# Patient Record
Sex: Female | Born: 1980
Health system: Southern US, Community
[De-identification: ages and names within clinical notes are randomized; demographics above are authoritative.]

## PROBLEM LIST (undated history)

## (undated) DIAGNOSIS — L709 Acne, unspecified: Secondary | ICD-10-CM

## (undated) DIAGNOSIS — N63 Unspecified lump in unspecified breast: Secondary | ICD-10-CM

## (undated) HISTORY — DX: Unspecified lump in unspecified breast: N63.0

## (undated) HISTORY — DX: Acne, unspecified: L70.9

---

## 2012-12-30 LAB — OB RESULTS CONSOLE HEPATITIS B SURFACE ANTIGEN: Hepatitis B Surface Ag: NEGATIVE

## 2013-03-09 LAB — OB RESULTS CONSOLE RUBELLA ANTIBODY, IGM: Rubella: IMMUNE

## 2013-05-31 ENCOUNTER — Encounter: Payer: Self-pay | Admitting: Obstetrics & Gynecology

## 2013-06-04 ENCOUNTER — Ambulatory Visit (INDEPENDENT_AMBULATORY_CARE_PROVIDER_SITE_OTHER): Payer: 59 | Admitting: Family Medicine

## 2013-06-04 VITALS — BP 115/78 | HR 81 | Temp 97.9°F | Resp 16 | Ht 67.5 in | Wt 122.8 lb

## 2013-06-04 DIAGNOSIS — Z331 Pregnant state, incidental: Secondary | ICD-10-CM

## 2013-06-04 DIAGNOSIS — N979 Female infertility, unspecified: Secondary | ICD-10-CM

## 2013-06-04 DIAGNOSIS — Z3490 Encounter for supervision of normal pregnancy, unspecified, unspecified trimester: Secondary | ICD-10-CM

## 2013-06-04 DIAGNOSIS — IMO0002 Reserved for concepts with insufficient information to code with codable children: Secondary | ICD-10-CM

## 2013-06-04 NOTE — Progress Notes (Signed)
   [address]   [place], [place]  [address]   [phone]  Subjective:    Patient ID: Cheryl Bailey, female    DOB: Oct 08, 1981, 32 y.o.   MRN: [id number]  HPI This 32 y.o. female presents for evaluation for porgestterone injection.  Currently [redacted] weeks pregnant.  Has been followed by infertility clinic in [place].  S/p in vitro fertilization; +pregnant on first attempt.  Nauseated; no vomiting.  Followed by infertility weekly to follow HCG level; will get first ultrasound next week.  Just moved from [facility]; husband coming back tomorrow.   Will be followed by infertility specialist in [facility] for first 12 weeks and then establish with local OB.  Presenting with orders from infertility specialist.  Review of Systems  Constitutional: Negative for fever, chills, diaphoresis and fatigue.  Gastrointestinal: Positive for nausea. Negative for vomiting.  Genitourinary: Negative for vaginal bleeding.    No past medical history on file.  No past surgical history on file.  Prior to Admission medications   Medication Sig Start Date End Date Taking? Authorizing Provider  aspirin 81 MG tablet Take 81 mg by mouth daily.   Yes Historical Provider, MD  estradiol valerate (DELESTROGEN) 20 MG/ML injection Inject 20 mg into the muscle. .43 units given on mondays and thursdays.   Yes Historical Provider, MD  Prenatal Vit-Fe Fumarate-FA (MULTIVITAMIN-PRENATAL) 27-0.8 MG TABS Take 1 tablet by mouth daily at 12 noon.   Yes Historical Provider, MD  PRESCRIPTION MEDICATION Inject 2 mLs as directed daily. . Progesterone.   Yes Historical Provider, MD    No Known Allergies  History   Social History  . Marital Status: Unknown    Spouse Name: N/A    Number of Children: N/A  . Years of Education: N/A   Occupational History  . Not on file.   Social History Main Topics  . Smoking status: Never Smoker   . Smokeless tobacco: Never Used  . Alcohol Use: No  . Drug Use: No  . Sexually Active: Yes    Other Topics Concern  . Not on file   Social History Narrative   Marital status: married; from [place]; moved to states 2011.      Children: one      Lives: with husband.      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      Tobacco: none      Alcohol:  None      Drugs: none    No family history on file.     Objective:   Physical Exam  Nursing note and vitals reviewed. Constitutional: She is oriented to person, place, and time. She appears well-developed and well-nourished. No distress.  HENT:  Head: Normocephalic and atraumatic.  Neurological: She is alert and oriented to person, place, and time.  Skin: Skin is warm and dry. She is not diaphoretic.  Psychiatric: She has a normal mood and affect. Her behavior is normal.   PROGESTERONE INJECTION 2 ML ADMINISTERED BY ANGIE MCFARLAND.    Assessment & Plan:  Infertility  Pregnancy   1. Pregnancy at 7 weeks:  Stable; followed by Infertility Specialist in [place]; presenting with orders for daily Progesterone injection 2ml IM.  Administered in office. To present to office while husband out of town.   2. Infertility: s/p successful in vitro fertilization; currently [redacted] weeks gestation; receiving twelve weeks of daily progesterone injections and twice weekly estrogen injections; orders presented from infertility specialist.

## 2013-06-06 ENCOUNTER — Ambulatory Visit (INDEPENDENT_AMBULATORY_CARE_PROVIDER_SITE_OTHER): Payer: 59 | Admitting: *Deleted

## 2013-06-06 ENCOUNTER — Encounter: Payer: Self-pay | Admitting: *Deleted

## 2013-06-06 VITALS — BP 118/74 | HR 70 | Resp 18

## 2013-06-06 DIAGNOSIS — Z331 Pregnant state, incidental: Secondary | ICD-10-CM

## 2013-06-06 DIAGNOSIS — N979 Female infertility, unspecified: Secondary | ICD-10-CM

## 2013-06-06 MED ORDER — PROGESTERONE 50 MG/ML IM OIL: 10.0000 mg | TOPICAL_OIL | Freq: Once | INTRAMUSCULAR | Status: DC

## 2013-06-06 MED ORDER — PROGESTERONE 50 MG/ML IM OIL
10.0000 mg | TOPICAL_OIL | Freq: Once | INTRAMUSCULAR | Status: AC
  Administered 2013-06-06: 10 mg via INTRAMUSCULAR

## 2013-06-07 ENCOUNTER — Ambulatory Visit (INDEPENDENT_AMBULATORY_CARE_PROVIDER_SITE_OTHER): Payer: 59 | Admitting: *Deleted

## 2013-06-07 DIAGNOSIS — N979 Female infertility, unspecified: Secondary | ICD-10-CM

## 2013-06-07 MED ORDER — PROGESTERONE 50 MG/ML IM OIL
100.0000 mg | TOPICAL_OIL | Freq: Once | INTRAMUSCULAR | Status: AC
  Administered 2013-06-07: 100 mg via INTRAMUSCULAR

## 2013-06-07 NOTE — Progress Notes (Signed)
  Subjective:    Patient ID: Cheryl Bailey, female    DOB: 06-08-1981, 32 y.o.   MRN: [id number]  HPI  32 YO female pt here today for her daily Progesterone Injection. Patient brings with her the order from her infertility specialist. Consulted Dr. Katrinka Blazing to review prior to administration.   Administered 2ml Progesterone 50mg /mL  to the Right upper outer Quad.   Pt states she forgot the Monday injection of her Delestrogen yesterday and has forgotten it again today. She will be back on Thursday.    Review of Systems     Objective:   Physical Exam        Assessment & Plan:

## 2013-06-08 ENCOUNTER — Encounter: Payer: 59 | Admitting: Obstetrics & Gynecology

## 2013-06-08 ENCOUNTER — Ambulatory Visit (INDEPENDENT_AMBULATORY_CARE_PROVIDER_SITE_OTHER): Payer: 59 | Admitting: *Deleted

## 2013-06-08 DIAGNOSIS — N979 Female infertility, unspecified: Secondary | ICD-10-CM

## 2013-06-08 MED ORDER — PROGESTERONE 50 MG/ML IM OIL
10.0000 mg | TOPICAL_OIL | Freq: Once | INTRAMUSCULAR | Status: AC
  Administered 2013-06-08: 10 mg via INTRAMUSCULAR

## 2013-06-17 ENCOUNTER — Ambulatory Visit (INDEPENDENT_AMBULATORY_CARE_PROVIDER_SITE_OTHER): Payer: 59 | Admitting: *Deleted

## 2013-06-17 DIAGNOSIS — N979 Female infertility, unspecified: Secondary | ICD-10-CM

## 2013-06-17 MED ORDER — PROGESTERONE 50 MG/ML IM OIL
100.0000 mg | TOPICAL_OIL | Freq: Once | INTRAMUSCULAR | Status: AC
  Administered 2013-06-17: 100 mg via INTRAMUSCULAR

## 2013-06-20 ENCOUNTER — Ambulatory Visit (INDEPENDENT_AMBULATORY_CARE_PROVIDER_SITE_OTHER): Payer: 59

## 2013-06-20 DIAGNOSIS — Z331 Pregnant state, incidental: Secondary | ICD-10-CM

## 2013-06-20 DIAGNOSIS — Z3490 Encounter for supervision of normal pregnancy, unspecified, unspecified trimester: Secondary | ICD-10-CM

## 2013-06-20 MED ORDER — PROGESTERONE 50 MG/ML IM OIL
100.0000 mg | TOPICAL_OIL | Freq: Once | INTRAMUSCULAR | Status: AC
  Administered 2013-06-20: 100 mg via INTRAMUSCULAR

## 2013-06-21 ENCOUNTER — Ambulatory Visit (INDEPENDENT_AMBULATORY_CARE_PROVIDER_SITE_OTHER): Payer: 59 | Admitting: Radiology

## 2013-06-21 DIAGNOSIS — Z3490 Encounter for supervision of normal pregnancy, unspecified, unspecified trimester: Secondary | ICD-10-CM

## 2013-06-21 DIAGNOSIS — Z331 Pregnant state, incidental: Secondary | ICD-10-CM

## 2013-06-21 MED ORDER — PROGESTERONE 50 MG/ML IM OIL
100.0000 mg | TOPICAL_OIL | Freq: Once | INTRAMUSCULAR | Status: AC
  Administered 2013-06-21: 100 mg via INTRAMUSCULAR

## 2013-06-22 ENCOUNTER — Ambulatory Visit (INDEPENDENT_AMBULATORY_CARE_PROVIDER_SITE_OTHER): Payer: 59 | Admitting: *Deleted

## 2013-06-22 DIAGNOSIS — N979 Female infertility, unspecified: Secondary | ICD-10-CM

## 2013-06-22 DIAGNOSIS — Z3490 Encounter for supervision of normal pregnancy, unspecified, unspecified trimester: Secondary | ICD-10-CM

## 2013-06-22 DIAGNOSIS — Z331 Pregnant state, incidental: Secondary | ICD-10-CM

## 2013-06-22 MED ORDER — PROGESTERONE 50 MG/ML IM OIL
100.0000 mg | TOPICAL_OIL | Freq: Once | INTRAMUSCULAR | Status: AC
  Administered 2013-06-22: 100 mg via INTRAMUSCULAR

## 2013-06-23 ENCOUNTER — Ambulatory Visit (INDEPENDENT_AMBULATORY_CARE_PROVIDER_SITE_OTHER): Payer: 59 | Admitting: *Deleted

## 2013-06-23 DIAGNOSIS — N979 Female infertility, unspecified: Secondary | ICD-10-CM

## 2013-06-23 MED ORDER — PROGESTERONE 50 MG/ML IM OIL
10.0000 mg | TOPICAL_OIL | Freq: Once | INTRAMUSCULAR | Status: AC
  Administered 2013-06-23: 10 mg via INTRAMUSCULAR

## 2013-06-28 ENCOUNTER — Ambulatory Visit (INDEPENDENT_AMBULATORY_CARE_PROVIDER_SITE_OTHER): Payer: 59 | Admitting: *Deleted

## 2013-06-28 DIAGNOSIS — Z3490 Encounter for supervision of normal pregnancy, unspecified, unspecified trimester: Secondary | ICD-10-CM

## 2013-06-28 DIAGNOSIS — N979 Female infertility, unspecified: Secondary | ICD-10-CM

## 2013-06-28 DIAGNOSIS — Z331 Pregnant state, incidental: Secondary | ICD-10-CM

## 2013-06-28 MED ORDER — PROGESTERONE 50 MG/ML IM OIL
100.0000 mg | TOPICAL_OIL | Freq: Once | INTRAMUSCULAR | Status: AC
  Administered 2013-06-28: 100 mg via INTRAMUSCULAR

## 2013-07-02 ENCOUNTER — Ambulatory Visit (INDEPENDENT_AMBULATORY_CARE_PROVIDER_SITE_OTHER): Payer: 59 | Admitting: Physician Assistant

## 2013-07-02 VITALS — BP 112/58 | HR 81 | Temp 98.7°F | Resp 16 | Ht 68.5 in | Wt 124.8 lb

## 2013-07-02 DIAGNOSIS — Z3490 Encounter for supervision of normal pregnancy, unspecified, unspecified trimester: Secondary | ICD-10-CM

## 2013-07-02 DIAGNOSIS — N979 Female infertility, unspecified: Secondary | ICD-10-CM

## 2013-07-02 DIAGNOSIS — IMO0002 Reserved for concepts with insufficient information to code with codable children: Secondary | ICD-10-CM

## 2013-07-02 DIAGNOSIS — Z331 Pregnant state, incidental: Secondary | ICD-10-CM

## 2013-07-02 MED ORDER — PROGESTERONE 50 MG/ML IM OIL
100.0000 mg | TOPICAL_OIL | Freq: Every day | INTRAMUSCULAR | Status: AC
  Administered 2013-07-02 – 2013-07-07 (×4): 100 mg via INTRAMUSCULAR

## 2013-07-02 NOTE — Progress Notes (Signed)
Pt presents for her injection.  She will also need one for tomorrow.

## 2013-07-03 ENCOUNTER — Ambulatory Visit (INDEPENDENT_AMBULATORY_CARE_PROVIDER_SITE_OTHER): Payer: 59 | Admitting: *Deleted

## 2013-07-03 DIAGNOSIS — IMO0002 Reserved for concepts with insufficient information to code with codable children: Secondary | ICD-10-CM

## 2013-07-03 DIAGNOSIS — N979 Female infertility, unspecified: Secondary | ICD-10-CM

## 2013-07-06 ENCOUNTER — Ambulatory Visit (INDEPENDENT_AMBULATORY_CARE_PROVIDER_SITE_OTHER): Payer: 59 | Admitting: Physician Assistant

## 2013-07-06 DIAGNOSIS — IMO0002 Reserved for concepts with insufficient information to code with codable children: Secondary | ICD-10-CM

## 2013-07-06 DIAGNOSIS — N979 Female infertility, unspecified: Secondary | ICD-10-CM

## 2013-07-06 NOTE — Progress Notes (Signed)
Patient is here for her injection. I was not part of this encounter. No patient-provider relationship occurred today. I was not made aware of an injection having occurred until after it taking place and the patient leaving the building. I was asked to close the encounter.   Eula Listen, PA-C 07/06/2013 7:03 PM

## 2013-07-07 ENCOUNTER — Ambulatory Visit (INDEPENDENT_AMBULATORY_CARE_PROVIDER_SITE_OTHER): Payer: 59 | Admitting: *Deleted

## 2013-07-07 DIAGNOSIS — IMO0002 Reserved for concepts with insufficient information to code with codable children: Secondary | ICD-10-CM

## 2013-07-07 DIAGNOSIS — N979 Female infertility, unspecified: Secondary | ICD-10-CM

## 2013-08-01 ENCOUNTER — Other Ambulatory Visit: Payer: Self-pay | Admitting: Obstetrics and Gynecology

## 2013-08-01 ENCOUNTER — Other Ambulatory Visit (HOSPITAL_COMMUNITY)
Admission: RE | Admit: 2013-08-01 | Discharge: 2013-08-01 | Disposition: A | Discharge status: Home / Self Care | Payer: Commercial Managed Care - PPO | Source: Ambulatory Visit | Attending: Obstetrics and Gynecology | Admitting: Obstetrics and Gynecology

## 2013-08-01 ENCOUNTER — Other Ambulatory Visit: Payer: Self-pay

## 2013-08-01 DIAGNOSIS — Z1151 Encounter for screening for human papillomavirus (HPV): Secondary | ICD-10-CM | POA: Insufficient documentation

## 2013-08-01 DIAGNOSIS — Z01419 Encounter for gynecological examination (general) (routine) without abnormal findings: Secondary | ICD-10-CM | POA: Insufficient documentation

## 2013-08-01 LAB — OB RESULTS CONSOLE GC/CHLAMYDIA
Chlamydia: NEGATIVE
GC PROBE AMP, GENITAL: NEGATIVE

## 2013-09-29 LAB — OB RESULTS CONSOLE RPR: RPR: NONREACTIVE

## 2013-09-29 LAB — OB RESULTS CONSOLE HIV ANTIBODY (ROUTINE TESTING): HIV: NONREACTIVE

## 2013-11-10 NOTE — L&D Delivery Note (Addendum)
Delivery Note    32yo G1P0 @ 2759w6d who presented for IOL due to IVF di/di twins (as dated by LMP c/w 9wk week ultrasound) presents for Induction of labor. The patient has been followed by antepartum surveillance including serial growth and modified BPP. A growth US performed @ 8368w1d showed Twin A with EFW of 21% with lagging AC of <3%. Per MFM recommendations- plan was made to continue with modified BPP and dopplers weekly with plan for delivery at 37-38 weeks.   IOL was started with cytotec.  She spontaneously ruptured- clear fluid and was started on PCN for GBS prophylaxis.  Induction was continued with Pitocin and she received an epidural for pain.  The patient progressed to complete dilation, an FSE was placed as the tracing was intermittent.  The patient was brought to the OR for delivery.    Verbal consent: obtained from patient for vacuum delivery.  Risks and benefits discussed in detail.  Risks include, but are not limited to the risks of anesthesia, bleeding, infection, damage to maternal tissues, fetal cephalhematoma.  Due to non-reassuring FHT at +3 station, Kiwi vacuum was applied- one pull was used, VAVD without complications of Twin A. Attention was turned to Twin B-membranes intact, with compound presentation: oblique fetal head with hand palpated. With internal manipulation, Twin B was brought vertex, AROM performed- clear fluid. Due to difficulty tracking FHT- FSE was placed on Twin B. Heart tones were noted to be in the 50s. Kiwi vacuum was applied to Twin B. Two pulls with one pop off, VAVD without complications. A second degree laceration was repaired in the usual fashion. Cervix was examined- no lacerations noted.  For further information, please see below.     Hall Busingmokpae, GIRLA Mylin [[id number]]  At 11:03 AM a viable female was delivered via Vaginal, Vacuum (Extractor) (Presentation: Left Occiput Anterior).  APGAR: 7, 8; weight 5 lb 6.2 oz (2445 g).   Placenta status: Intact,  Spontaneous Pathology.  Cord: 3 vessels with the following complications: None.  Anesthesia: Epidural  Episiotomy:  Lacerations: 2nd degree;Perineal Suture Repair: 3.0 vicryl Est. Blood Loss (mL):     Devoria Glassingmokpae, GirlB Jaylin [[id number]][[id number]]  At 11:27 AM a viable female was delivered via Vaginal, Vacuum (Extractor) (Presentation: Left Occiput Anterior).  APGAR: 7, 8; weight .   Placenta status: Intact, Spontaneous.  Cord: 3 vessels with the following complications: None.  Anesthesia: Epidural  Episiotomy: None Lacerations: 2nd degree;Perineal Suture Repair: 3.0 vicryl Est. Blood Loss (mL): 500cc   Mom to postpartum.   Baby A to Couplet care / Skin to Skin.   Baby B to Couplet care / Skin to Skin.  Myna HidalgoZAN, Sharolyn Weber, M 01/04/2014, 12:05 PM

## 2013-12-01 ENCOUNTER — Other Ambulatory Visit (HOSPITAL_COMMUNITY): Payer: Self-pay | Admitting: Obstetrics & Gynecology

## 2013-12-01 ENCOUNTER — Ambulatory Visit (HOSPITAL_COMMUNITY)
Admission: RE | Admit: 2013-12-01 | Discharge: 2013-12-01 | Disposition: A | Discharge status: Home / Self Care | Payer: 59 | Source: Ambulatory Visit | Attending: Obstetrics & Gynecology | Admitting: Obstetrics & Gynecology

## 2013-12-01 ENCOUNTER — Ambulatory Visit (HOSPITAL_COMMUNITY): Payer: Commercial Managed Care - PPO

## 2013-12-01 ENCOUNTER — Encounter (HOSPITAL_COMMUNITY): Payer: Self-pay

## 2013-12-01 DIAGNOSIS — O288 Other abnormal findings on antenatal screening of mother: Secondary | ICD-10-CM

## 2013-12-01 DIAGNOSIS — O30009 Twin pregnancy, unspecified number of placenta and unspecified number of amniotic sacs, unspecified trimester: Secondary | ICD-10-CM | POA: Insufficient documentation

## 2013-12-01 DIAGNOSIS — O289 Unspecified abnormal findings on antenatal screening of mother: Secondary | ICD-10-CM | POA: Insufficient documentation

## 2013-12-01 DIAGNOSIS — O30049 Twin pregnancy, dichorionic/diamniotic, unspecified trimester: Secondary | ICD-10-CM | POA: Insufficient documentation

## 2013-12-01 DIAGNOSIS — O09819 Supervision of pregnancy resulting from assisted reproductive technology, unspecified trimester: Secondary | ICD-10-CM | POA: Insufficient documentation

## 2013-12-01 NOTE — Progress Notes (Signed)
Baby A had decel down to 70's  At approx 1618.   Pt repositioned to right tilt with return to baseline.  MD aware.

## 2013-12-08 ENCOUNTER — Other Ambulatory Visit (HOSPITAL_COMMUNITY): Payer: Self-pay | Admitting: Obstetrics and Gynecology

## 2013-12-08 DIAGNOSIS — O30009 Twin pregnancy, unspecified number of placenta and unspecified number of amniotic sacs, unspecified trimester: Secondary | ICD-10-CM

## 2013-12-09 ENCOUNTER — Ambulatory Visit (HOSPITAL_COMMUNITY)
Admission: RE | Admit: 2013-12-09 | Discharge: 2013-12-09 | Disposition: A | Discharge status: Home / Self Care | Payer: 59 | Source: Ambulatory Visit | Attending: Obstetrics & Gynecology | Admitting: Obstetrics & Gynecology

## 2013-12-09 ENCOUNTER — Other Ambulatory Visit (HOSPITAL_COMMUNITY): Payer: Self-pay | Admitting: Obstetrics and Gynecology

## 2013-12-09 DIAGNOSIS — O09819 Supervision of pregnancy resulting from assisted reproductive technology, unspecified trimester: Secondary | ICD-10-CM | POA: Insufficient documentation

## 2013-12-09 DIAGNOSIS — O30009 Twin pregnancy, unspecified number of placenta and unspecified number of amniotic sacs, unspecified trimester: Secondary | ICD-10-CM | POA: Insufficient documentation

## 2013-12-09 DIAGNOSIS — O288 Other abnormal findings on antenatal screening of mother: Secondary | ICD-10-CM

## 2013-12-09 DIAGNOSIS — O289 Unspecified abnormal findings on antenatal screening of mother: Secondary | ICD-10-CM | POA: Diagnosis not present

## 2013-12-16 ENCOUNTER — Ambulatory Visit (HOSPITAL_COMMUNITY)
Admission: RE | Admit: 2013-12-16 | Discharge: 2013-12-16 | Disposition: A | Discharge status: Home / Self Care | Payer: 59 | Source: Ambulatory Visit | Attending: Obstetrics and Gynecology | Admitting: Obstetrics and Gynecology

## 2013-12-16 ENCOUNTER — Other Ambulatory Visit (HOSPITAL_COMMUNITY): Payer: Self-pay | Admitting: Obstetrics and Gynecology

## 2013-12-16 ENCOUNTER — Ambulatory Visit (HOSPITAL_COMMUNITY): Payer: 59

## 2013-12-16 ENCOUNTER — Encounter (HOSPITAL_COMMUNITY): Payer: Self-pay

## 2013-12-16 DIAGNOSIS — O30009 Twin pregnancy, unspecified number of placenta and unspecified number of amniotic sacs, unspecified trimester: Secondary | ICD-10-CM

## 2013-12-16 DIAGNOSIS — O09819 Supervision of pregnancy resulting from assisted reproductive technology, unspecified trimester: Secondary | ICD-10-CM | POA: Diagnosis not present

## 2013-12-20 ENCOUNTER — Other Ambulatory Visit (HOSPITAL_COMMUNITY): Payer: Self-pay | Admitting: Obstetrics and Gynecology

## 2013-12-20 DIAGNOSIS — O30009 Twin pregnancy, unspecified number of placenta and unspecified number of amniotic sacs, unspecified trimester: Secondary | ICD-10-CM

## 2013-12-22 ENCOUNTER — Ambulatory Visit (HOSPITAL_COMMUNITY)
Admission: RE | Admit: 2013-12-22 | Discharge: 2013-12-22 | Disposition: A | Discharge status: Home / Self Care | Payer: 59 | Source: Ambulatory Visit | Attending: Obstetrics and Gynecology | Admitting: Obstetrics and Gynecology

## 2013-12-22 VITALS — BP 122/77 | HR 86 | Wt 159.5 lb

## 2013-12-22 DIAGNOSIS — O30009 Twin pregnancy, unspecified number of placenta and unspecified number of amniotic sacs, unspecified trimester: Secondary | ICD-10-CM | POA: Diagnosis present

## 2013-12-22 DIAGNOSIS — O09819 Supervision of pregnancy resulting from assisted reproductive technology, unspecified trimester: Secondary | ICD-10-CM | POA: Insufficient documentation

## 2013-12-22 DIAGNOSIS — O358XX0 Maternal care for other (suspected) fetal abnormality and damage, not applicable or unspecified: Secondary | ICD-10-CM | POA: Insufficient documentation

## 2013-12-22 DIAGNOSIS — O365990 Maternal care for other known or suspected poor fetal growth, unspecified trimester, not applicable or unspecified: Secondary | ICD-10-CM | POA: Insufficient documentation

## 2013-12-23 ENCOUNTER — Ambulatory Visit (HOSPITAL_COMMUNITY): Payer: 59

## 2013-12-23 ENCOUNTER — Other Ambulatory Visit (HOSPITAL_COMMUNITY): Payer: Commercial Managed Care - PPO

## 2013-12-29 ENCOUNTER — Ambulatory Visit (HOSPITAL_COMMUNITY)
Admission: RE | Admit: 2013-12-29 | Discharge: 2013-12-29 | Disposition: A | Discharge status: Home / Self Care | Payer: 59 | Source: Ambulatory Visit | Attending: Obstetrics and Gynecology | Admitting: Obstetrics and Gynecology

## 2013-12-29 ENCOUNTER — Other Ambulatory Visit (HOSPITAL_COMMUNITY): Payer: Self-pay | Admitting: Maternal and Fetal Medicine

## 2013-12-29 DIAGNOSIS — O09819 Supervision of pregnancy resulting from assisted reproductive technology, unspecified trimester: Secondary | ICD-10-CM | POA: Insufficient documentation

## 2013-12-29 DIAGNOSIS — O30009 Twin pregnancy, unspecified number of placenta and unspecified number of amniotic sacs, unspecified trimester: Secondary | ICD-10-CM | POA: Insufficient documentation

## 2013-12-29 DIAGNOSIS — O358XX0 Maternal care for other (suspected) fetal abnormality and damage, not applicable or unspecified: Secondary | ICD-10-CM | POA: Insufficient documentation

## 2013-12-29 DIAGNOSIS — O365990 Maternal care for other known or suspected poor fetal growth, unspecified trimester, not applicable or unspecified: Secondary | ICD-10-CM | POA: Insufficient documentation

## 2013-12-30 ENCOUNTER — Ambulatory Visit (HOSPITAL_COMMUNITY): Payer: 59

## 2013-12-30 ENCOUNTER — Telehealth (HOSPITAL_COMMUNITY): Payer: Self-pay | Admitting: *Deleted

## 2013-12-30 ENCOUNTER — Encounter (HOSPITAL_COMMUNITY): Payer: Self-pay | Admitting: *Deleted

## 2013-12-30 NOTE — Telephone Encounter (Signed)
Preadmission screen  

## 2014-01-03 ENCOUNTER — Inpatient Hospital Stay (HOSPITAL_COMMUNITY)
Admission: RE | Admit: 2014-01-03 | Discharge: 2014-01-06 | DRG: 774 | Disposition: A | Discharge status: Home / Self Care | Payer: 59 | Source: Ambulatory Visit | Attending: Obstetrics & Gynecology | Admitting: Obstetrics & Gynecology

## 2014-01-03 ENCOUNTER — Encounter (HOSPITAL_COMMUNITY): Payer: Self-pay

## 2014-01-03 DIAGNOSIS — Z22330 Carrier of Group B streptococcus: Secondary | ICD-10-CM

## 2014-01-03 DIAGNOSIS — O329XX0 Maternal care for malpresentation of fetus, unspecified, not applicable or unspecified: Secondary | ICD-10-CM

## 2014-01-03 DIAGNOSIS — O99892 Other specified diseases and conditions complicating childbirth: Secondary | ICD-10-CM | POA: Diagnosis present

## 2014-01-03 DIAGNOSIS — O2650 Maternal hypotension syndrome, unspecified trimester: Secondary | ICD-10-CM | POA: Diagnosis not present

## 2014-01-03 DIAGNOSIS — D649 Anemia, unspecified: Secondary | ICD-10-CM | POA: Diagnosis present

## 2014-01-03 DIAGNOSIS — O9989 Other specified diseases and conditions complicating pregnancy, childbirth and the puerperium: Secondary | ICD-10-CM

## 2014-01-03 DIAGNOSIS — O3090 Multiple gestation, unspecified, unspecified trimester: Secondary | ICD-10-CM | POA: Diagnosis present

## 2014-01-03 DIAGNOSIS — O30049 Twin pregnancy, dichorionic/diamniotic, unspecified trimester: Secondary | ICD-10-CM

## 2014-01-03 DIAGNOSIS — O9902 Anemia complicating childbirth: Secondary | ICD-10-CM | POA: Diagnosis present

## 2014-01-03 DIAGNOSIS — O328XX0 Maternal care for other malpresentation of fetus, not applicable or unspecified: Secondary | ICD-10-CM | POA: Diagnosis present

## 2014-01-03 LAB — CBC
HCT: 34.9 % — ABNORMAL LOW (ref 36.0–46.0)
Hemoglobin: 11.9 g/dL — ABNORMAL LOW (ref 12.0–15.0)
MCH: 33.6 pg (ref 26.0–34.0)
MCHC: 34.1 g/dL (ref 30.0–36.0)
MCV: 98.6 fL (ref 78.0–100.0)
PLATELETS: 210 10*3/uL (ref 150–400)
RBC: 3.54 MIL/uL — ABNORMAL LOW (ref 3.87–5.11)
RDW: 12.9 % (ref 11.5–15.5)
WBC: 9.2 10*3/uL (ref 4.0–10.5)

## 2014-01-03 LAB — OB RESULTS CONSOLE GBS: STREP GROUP B AG: POSITIVE

## 2014-01-03 LAB — ABO/RH: ABO/RH(D): B POS

## 2014-01-03 MED ORDER — LIDOCAINE HCL (PF) 1 % IJ SOLN
30.0000 mL | INTRAMUSCULAR | Status: DC | PRN
  Filled 2014-01-03: qty 30

## 2014-01-03 MED ORDER — IBUPROFEN 600 MG PO TABS: 600.0000 mg | ORAL_TABLET | Freq: Four times a day (QID) | ORAL | Status: DC | PRN

## 2014-01-03 MED ORDER — FLEET ENEMA 7-19 GM/118ML RE ENEM: 1.0000 | ENEMA | RECTAL | Status: DC | PRN

## 2014-01-03 MED ORDER — ACETAMINOPHEN 325 MG PO TABS: 650.0000 mg | ORAL_TABLET | ORAL | Status: DC | PRN

## 2014-01-03 MED ORDER — LACTATED RINGERS IV SOLN
INTRAVENOUS | Status: DC
  Administered 2014-01-03 – 2014-01-04 (×2): via INTRAVENOUS

## 2014-01-03 MED ORDER — PENICILLIN G POTASSIUM 5000000 UNITS IJ SOLR
5.0000 10*6.[IU] | Freq: Once | INTRAMUSCULAR | Status: DC
  Filled 2014-01-03: qty 5

## 2014-01-03 MED ORDER — PENICILLIN G POTASSIUM 5000000 UNITS IJ SOLR
5.0000 10*6.[IU] | Freq: Once | INTRAVENOUS | Status: AC
  Administered 2014-01-04: 5 10*6.[IU] via INTRAVENOUS
  Filled 2014-01-03 (×2): qty 5

## 2014-01-03 MED ORDER — TERBUTALINE SULFATE 1 MG/ML IJ SOLN: 0.2500 mg | Freq: Once | INTRAMUSCULAR | Status: AC | PRN

## 2014-01-03 MED ORDER — DEXTROSE 5 % IV SOLN
2.5000 10*6.[IU] | INTRAVENOUS | Status: DC
  Administered 2014-01-04: 2.5 10*6.[IU] via INTRAVENOUS
  Filled 2014-01-03 (×2): qty 2.5

## 2014-01-03 MED ORDER — OXYTOCIN BOLUS FROM INFUSION: 500.0000 mL | INTRAVENOUS | Status: DC

## 2014-01-03 MED ORDER — OXYCODONE-ACETAMINOPHEN 5-325 MG PO TABS: 1.0000 | ORAL_TABLET | ORAL | Status: DC | PRN

## 2014-01-03 MED ORDER — CITRIC ACID-SODIUM CITRATE 334-500 MG/5ML PO SOLN: 30.0000 mL | ORAL | Status: DC | PRN

## 2014-01-03 MED ORDER — ONDANSETRON HCL 4 MG/2ML IJ SOLN: 4.0000 mg | Freq: Four times a day (QID) | INTRAMUSCULAR | Status: DC | PRN

## 2014-01-03 MED ORDER — DEXTROSE 5 % IV SOLN
2.5000 10*6.[IU] | INTRAVENOUS | Status: DC
  Filled 2014-01-03 (×2): qty 2.5

## 2014-01-03 MED ORDER — OXYTOCIN 40 UNITS IN LACTATED RINGERS INFUSION - SIMPLE MED: 62.5000 mL/h | INTRAVENOUS | Status: DC

## 2014-01-03 MED ORDER — ZOLPIDEM TARTRATE 5 MG PO TABS
5.0000 mg | ORAL_TABLET | Freq: Every evening | ORAL | Status: DC | PRN
  Administered 2014-01-03: 5 mg via ORAL
  Filled 2014-01-03 (×2): qty 1

## 2014-01-03 MED ORDER — LACTATED RINGERS IV SOLN
500.0000 mL | INTRAVENOUS | Status: DC | PRN
  Administered 2014-01-04: 1000 mL via INTRAVENOUS

## 2014-01-03 MED ORDER — MISOPROSTOL 25 MCG QUARTER TABLET
25.0000 ug | ORAL_TABLET | ORAL | Status: DC | PRN
  Administered 2014-01-03: 25 ug via VAGINAL
  Filled 2014-01-03: qty 0.25

## 2014-01-03 NOTE — H&P (Signed)
  HPI: 33 y/o G1P0 @ 7250w5d with IVF di/di twins (as dated by LMP c/w 9wk week ultrasound) presents for Induction of labor.  The patient has been followed by antepartum surveillance including serial growth and modified BPP.  A growth US performed @ 2036w1d showed Twin A with EFW of 21% with lagging AC of <3%.  Per MFM recommendations- plan was made to continue with modified BPP and dopplers weekly with plan for delivery at 37-38 weeks.    Patient reports occasional Braxton-Hicks, but no regular contractions.  no Leaking of Fluid,   no Vaginal Bleeding,   + Fetal Movement x2.  ROS: no HA, no epigastric pain, no visual changes.    Pregnancy complicated by: 1) Di/Di twins with concern for S<D in Twin A -intracardiac echogenic focus (LV) of both tiwns -antepartum surveillance as mentioned above.  Last performed on 2/19: both twins with BPP 8/8 and normal dopplers.  Last growth 2/6 @ 4236w1d: Twin A- vertex (maternal left), EFW: 2106gm, 4#10oz (21%) with AC <3%, normal doppler Twin B- variable vertex/transverse, EFW: 2271g, 5# (33%), normal doppler Discordance: 7%  2) Anemia in pregnancy- on iron daily    PNL:  GBS positive, Rub Immune, Hep B neg, RPR NR, HIV neg, GC/C neg, glucola:152, 3hr glucola- negative H/H: 10.9/32.4, plt 201 Blood type: B positive  OBHx: primip PMHx:  none Meds:  PNV, integra daily Allergy:  No Known Allergies SurgHx: none SocHx:   no Tobacco, no  EtOH, no Illicit Drugs  O: LMP 04/14/2013 Gen. AAOx3, NAD CV.  RRR  No murmur.  Resp. CTAB, no wheeze or crackles. Abd. Gravid,  no tenderness,  no rigidity,  no guarding Extr.  no edema B/L , no calf tenderness SVE: closed/long/-3, soft, posterior    Prenatal Transfer Tool  Maternal Diabetes: No Genetic Screening: Normal Maternal Ultrasounds/Referrals: Abnormal:  Findings:   Isolated EIF (echogenic intracardiac focus) Fetal Ultrasounds or other Referrals:  Referred to Materal Fetal Medicine  Maternal Substance  Abuse:  No Significant Maternal Medications:  None Significant Maternal Lab Results: Lab values include: Group B Strep positive  Labs: see orders  A/P:  33 y.o. G1P0 @ 2050w5d EGA who presents for IOL due to di/di twins with concern for lagging growth of Twin A. -FWB:  Recent BPP and doppler performed on 2/19 both twins 8/8 with normal dopplers Prior to admission delivery options were reviewed with the patient.  Based on the location of the twins- vertex/variable-vertex/transverse- all options were reviewed with patient including vaginal delivery of both twins, thereby possibly requiring maneuvers of twin B including cephalic version or breech extraction versus primary C-section delivery.  Risk, benefit and alternatives of each option were discussed with patient including but not limited to bleeding, fetal injury including fetal entrapment and need for emergent C-section.  Questions and concerns were answered and the patient wishes to proceed with vaginal delivery.  -IOL: Will start induction with cytotec per protocol.   -GBS: positive, pt to receive PCN once in active labor -Pt desires epidural for pain management   Myna HidalgoJennifer Karlene Southard, DO [phone] (pager) [phone] (office)

## 2014-01-04 ENCOUNTER — Encounter (HOSPITAL_COMMUNITY): Payer: 59 | Admitting: Anesthesiology

## 2014-01-04 ENCOUNTER — Encounter (HOSPITAL_COMMUNITY)
Admission: RE | Disposition: A | Discharge status: Home / Self Care | Payer: Self-pay | Source: Ambulatory Visit | Attending: Obstetrics & Gynecology

## 2014-01-04 ENCOUNTER — Inpatient Hospital Stay (HOSPITAL_COMMUNITY): Payer: 59 | Admitting: Anesthesiology

## 2014-01-04 ENCOUNTER — Encounter (HOSPITAL_COMMUNITY): Payer: Self-pay

## 2014-01-04 LAB — CBC
HEMATOCRIT: 28.1 % — AB (ref 36.0–46.0)
Hemoglobin: 9.5 g/dL — ABNORMAL LOW (ref 12.0–15.0)
MCH: 33.8 pg (ref 26.0–34.0)
MCHC: 33.8 g/dL (ref 30.0–36.0)
MCV: 100 fL (ref 78.0–100.0)
Platelets: 185 10*3/uL (ref 150–400)
RBC: 2.81 MIL/uL — AB (ref 3.87–5.11)
RDW: 12.9 % (ref 11.5–15.5)
WBC: 11.2 10*3/uL — AB (ref 4.0–10.5)

## 2014-01-04 LAB — POSTPARTUM HEMORRHAGE PROTOCOL (BB NOTIFICATION)

## 2014-01-04 SURGERY — Surgical Case
Anesthesia: Epidural | Site: Vagina

## 2014-01-04 MED ORDER — LACTATED RINGERS IV BOLUS (SEPSIS): 1000.0000 mL | Freq: Once | INTRAVENOUS | Status: DC

## 2014-01-04 MED ORDER — PHENYLEPHRINE 40 MCG/ML (10ML) SYRINGE FOR IV PUSH (FOR BLOOD PRESSURE SUPPORT)
80.0000 ug | PREFILLED_SYRINGE | INTRAVENOUS | Status: DC | PRN
  Filled 2014-01-04: qty 10

## 2014-01-04 MED ORDER — ONDANSETRON HCL 4 MG PO TABS: 4.0000 mg | ORAL_TABLET | ORAL | Status: DC | PRN

## 2014-01-04 MED ORDER — BISACODYL 10 MG RE SUPP
10.0000 mg | Freq: Every day | RECTAL | Status: DC | PRN
  Filled 2014-01-04: qty 1

## 2014-01-04 MED ORDER — DIBUCAINE 1 % RE OINT
1.0000 "application " | TOPICAL_OINTMENT | RECTAL | Status: DC | PRN
  Filled 2014-01-04 (×2): qty 28

## 2014-01-04 MED ORDER — BENZOCAINE-MENTHOL 20-0.5 % EX AERO
1.0000 "application " | INHALATION_SPRAY | CUTANEOUS | Status: DC | PRN
  Administered 2014-01-04: 1 via TOPICAL
  Filled 2014-01-04 (×2): qty 56

## 2014-01-04 MED ORDER — OXYTOCIN 40 UNITS IN LACTATED RINGERS INFUSION - SIMPLE MED
1.0000 m[IU]/min | INTRAVENOUS | Status: DC
Start: 2014-01-04 — End: 2014-01-04
  Administered 2014-01-04: 2 m[IU]/min via INTRAVENOUS
  Filled 2014-01-04: qty 1000

## 2014-01-04 MED ORDER — EPHEDRINE 5 MG/ML INJ: 10.0000 mg | INTRAVENOUS | Status: DC | PRN

## 2014-01-04 MED ORDER — MISOPROSTOL 200 MCG PO TABS
ORAL_TABLET | ORAL | Status: AC
  Filled 2014-01-04: qty 5

## 2014-01-04 MED ORDER — IBUPROFEN 600 MG PO TABS
600.0000 mg | ORAL_TABLET | Freq: Four times a day (QID) | ORAL | Status: DC
  Administered 2014-01-04 – 2014-01-06 (×7): 600 mg via ORAL
  Filled 2014-01-04 (×7): qty 1

## 2014-01-04 MED ORDER — LIDOCAINE HCL (PF) 1 % IJ SOLN
INTRAMUSCULAR | Status: DC | PRN
  Administered 2014-01-04 (×2): 5 mL

## 2014-01-04 MED ORDER — BUTORPHANOL TARTRATE 1 MG/ML IJ SOLN
1.0000 mg | INTRAMUSCULAR | Status: DC | PRN
  Administered 2014-01-04 (×2): 1 mg via INTRAVENOUS
  Filled 2014-01-04 (×2): qty 1

## 2014-01-04 MED ORDER — OXYCODONE-ACETAMINOPHEN 5-325 MG PO TABS: 1.0000 | ORAL_TABLET | ORAL | Status: DC | PRN

## 2014-01-04 MED ORDER — PHENYLEPHRINE 40 MCG/ML (10ML) SYRINGE FOR IV PUSH (FOR BLOOD PRESSURE SUPPORT): 80.0000 ug | PREFILLED_SYRINGE | INTRAVENOUS | Status: DC | PRN

## 2014-01-04 MED ORDER — OXYTOCIN 40 UNITS IN LACTATED RINGERS INFUSION - SIMPLE MED
62.5000 mL/h | INTRAVENOUS | Status: DC | PRN
  Administered 2014-01-04: 999 mL/h via INTRAVENOUS
  Administered 2014-01-04: 62.5 mL/h via INTRAVENOUS
  Filled 2014-01-04: qty 1000

## 2014-01-04 MED ORDER — FLEET ENEMA 7-19 GM/118ML RE ENEM: 1.0000 | ENEMA | Freq: Every day | RECTAL | Status: DC | PRN

## 2014-01-04 MED ORDER — PHENYLEPHRINE 40 MCG/ML (10ML) SYRINGE FOR IV PUSH (FOR BLOOD PRESSURE SUPPORT)
PREFILLED_SYRINGE | INTRAVENOUS | Status: AC
  Filled 2014-01-04: qty 5

## 2014-01-04 MED ORDER — EPHEDRINE 5 MG/ML INJ
10.0000 mg | INTRAVENOUS | Status: DC | PRN
  Filled 2014-01-04: qty 4

## 2014-01-04 MED ORDER — ONDANSETRON HCL 4 MG/2ML IJ SOLN: 4.0000 mg | INTRAMUSCULAR | Status: DC | PRN

## 2014-01-04 MED ORDER — SENNOSIDES-DOCUSATE SODIUM 8.6-50 MG PO TABS
2.0000 | ORAL_TABLET | ORAL | Status: DC
  Administered 2014-01-04 – 2014-01-06 (×2): 2 via ORAL
  Filled 2014-01-04 (×2): qty 2

## 2014-01-04 MED ORDER — PRENATAL MULTIVITAMIN CH
1.0000 | ORAL_TABLET | Freq: Every day | ORAL | Status: DC
  Administered 2014-01-05: 1 via ORAL
  Filled 2014-01-04: qty 1

## 2014-01-04 MED ORDER — DIPHENHYDRAMINE HCL 25 MG PO CAPS: 25.0000 mg | ORAL_CAPSULE | Freq: Four times a day (QID) | ORAL | Status: DC | PRN

## 2014-01-04 MED ORDER — FENTANYL 2.5 MCG/ML BUPIVACAINE 1/10 % EPIDURAL INFUSION (WH - ANES)
14.0000 mL/h | INTRAMUSCULAR | Status: DC | PRN
  Administered 2014-01-04: 14 mL/h via EPIDURAL
  Filled 2014-01-04: qty 125

## 2014-01-04 MED ORDER — OXYTOCIN 40 UNITS IN LACTATED RINGERS INFUSION - SIMPLE MED: 1.0000 m[IU]/min | INTRAVENOUS | Status: DC

## 2014-01-04 MED ORDER — PHENYLEPH-SHARK LIV OIL-MO-PET 0.25-3-14-71.9 % RE OINT: TOPICAL_OINTMENT | Freq: Two times a day (BID) | RECTAL | Status: DC | PRN

## 2014-01-04 MED ORDER — LANOLIN HYDROUS EX OINT: TOPICAL_OINTMENT | CUTANEOUS | Status: DC | PRN

## 2014-01-04 MED ORDER — SIMETHICONE 80 MG PO CHEW: 80.0000 mg | CHEWABLE_TABLET | ORAL | Status: DC | PRN

## 2014-01-04 MED ORDER — ZOLPIDEM TARTRATE 5 MG PO TABS: 5.0000 mg | ORAL_TABLET | Freq: Every evening | ORAL | Status: DC | PRN

## 2014-01-04 MED ORDER — LACTATED RINGERS IV SOLN
500.0000 mL | Freq: Once | INTRAVENOUS | Status: AC
  Administered 2014-01-04: 500 mL via INTRAVENOUS

## 2014-01-04 MED ORDER — PHENYLEPHRINE 40 MCG/ML (10ML) SYRINGE FOR IV PUSH (FOR BLOOD PRESSURE SUPPORT)
PREFILLED_SYRINGE | INTRAVENOUS | Status: AC
  Filled 2014-01-04: qty 10

## 2014-01-04 MED ORDER — WITCH HAZEL-GLYCERIN EX PADS
1.0000 "application " | MEDICATED_PAD | CUTANEOUS | Status: DC | PRN
  Administered 2014-01-04: 1 via TOPICAL

## 2014-01-04 MED ORDER — DIPHENHYDRAMINE HCL 50 MG/ML IJ SOLN: 12.5000 mg | INTRAMUSCULAR | Status: DC | PRN

## 2014-01-04 NOTE — Progress Notes (Signed)
OR notified of patient status

## 2014-01-04 NOTE — Progress Notes (Signed)
At bedside due to symptomatic hypotension:  Per RN: After passage of two large clots ~ 500cc.  Pt noted to be hypotensive with loss of consciousness. Anesthesia was in the room at the time- fluid bolus and ephedrine was given.  A 2nd IV was started, stat CBC was collected.  PPH drill was called.  Patient responded immediately- now alert and awake, already started to feel better.  On examination- uterus remains firm below umbilicus.  Lochia appropriate.  Suspect large clots from boggy lower uterine segment.      Hypotension due to rapid volume loss and passage of clots.  Will hold off on uterotonics as her uterus remains firm.  Continue IV fluids, strict I/Os, will consider transfusion if Hgb <7.    Myna HidalgoJennifer Tehillah Cipriani, DO [phone]) [phone] (office)

## 2014-01-04 NOTE — Anesthesia Procedure Notes (Signed)
Epidural Patient location during procedure: OB Start time: 01/04/2014 5:01 AM  Staffing Anesthesiologist: Brayton CavesJACKSON, Danaja Lasota Performed by: anesthesiologist   Preanesthetic Checklist Completed: patient identified, site marked, surgical consent, pre-op evaluation, timeout performed, IV checked, risks and benefits discussed and monitors and equipment checked  Epidural Patient position: sitting Prep: site prepped and draped and DuraPrep Patient monitoring: continuous pulse ox and blood pressure Approach: midline Injection technique: LOR air  Needle:  Needle type: Tuohy  Needle gauge: 17 G Needle length: 9 cm and 9 Needle insertion depth: 5 cm cm Catheter type: closed end flexible Catheter size: 19 Gauge Catheter at skin depth: 10 cm Test dose: negative  Assessment Events: blood not aspirated, injection not painful, no injection resistance, negative IV test and no paresthesia  Additional Notes Patient identified.  Risk benefits discussed including failed block, incomplete pain control, headache, nerve damage, paralysis, blood pressure changes, nausea, vomiting, reactions to medication both toxic or allergic, and postpartum back pain.  Patient expressed understanding and wished to proceed.  All questions were answered.  Sterile technique used throughout procedure and epidural site dressed with sterile barrier dressing. No paresthesia or other complications noted.The patient did not experience any signs of intravascular injection such as tinnitus or metallic taste in mouth nor signs of intrathecal spread such as rapid motor block. Please see nursing notes for vital signs.

## 2014-01-04 NOTE — Progress Notes (Signed)
Per Dr Charlotta Newtonzan...POC to deliver twins in OR setting.

## 2014-01-04 NOTE — Progress Notes (Signed)
Transfer from OR to 163 for continued recovery of SVD.

## 2014-01-04 NOTE — Progress Notes (Signed)
Anesthesia notified of epidural catheter remaining in place per history of PPH this morning.  Anesthesia will reevaluate this afternoon.

## 2014-01-04 NOTE — Anesthesia Preprocedure Evaluation (Signed)
Anesthesia Evaluation  Patient identified by MRN, date of birth, ID band Patient awake    Reviewed: Allergy & Precautions, H&P , Patient's Chart, lab work & pertinent test results  Airway Mallampati: II  TM Distance: >3 FB Neck ROM: full    Dental   Pulmonary  breath sounds clear to auscultation        Cardiovascular Rhythm:regular Rate:Normal     Neuro/Psych    GI/Hepatic   Endo/Other    Renal/GU      Musculoskeletal   Abdominal   Peds  Hematology   Anesthesia Other Findings twins  Reproductive/Obstetrics (+) Pregnancy                             Anesthesia Physical Anesthesia Plan  ASA: II  Anesthesia Plan: Epidural   Post-op Pain Management:    Induction:   Airway Management Planned:   Additional Equipment:   Intra-op Plan:   Post-operative Plan:   Informed Consent: I have reviewed the patients History and Physical, chart, labs and discussed the procedure including the risks, benefits and alternatives for the proposed anesthesia with the patient or authorized representative who has indicated his/her understanding and acceptance.     Plan Discussed with:   Anesthesia Plan Comments:         Anesthesia Quick Evaluation  

## 2014-01-04 NOTE — Progress Notes (Signed)
Anesthesia at bedside.  PPH initiated

## 2014-01-04 NOTE — Progress Notes (Signed)
Patient called out stating she felt a gush. Bleeding assessed, small. Pad changed and patient felt another gush with elevating hips. Small gush seen, fundus firm 2 below umbilicus but very deviated to right. Foley draining well. New pad applied, patient told to call for any more gushes. Will reassess in about an hour. Dr. Charlotta Newtonzan notified of fundus being very deviated to right and charted that way since 1530. No new orders. Patient may have foley until AM.

## 2014-01-04 NOTE — Progress Notes (Signed)
Dr Charlotta Newtonzan updated on lab results.  Continue to monitor I+O and BP's.

## 2014-01-04 NOTE — Lactation Note (Signed)
This note was copied from the chart of Cheryl Kassy Tortora. Lactation Consultation Note  Patient Name: Cheryl Bailey Today's Date: 01/04/2014 Reason for consult: Initial assessment;Multiple gestation;Late preterm infant;Infant < 6lbs.  LC arrived to observe baby "A" well-latched to mom's (L) breast with widely flanged lips and some intermittent strong sucks.  Swallows undetectable but mom says baby has already been latched for almost 30 minutes.  Mom also reports that babies received a bottle with formula when she was too ill to breastfeed them but she plans to exclusively breastfeed now and states "B" baby has also latched once.  LC discussed need for frequent STS and a minimum of q3h feedings due to babies weights and gestation.  LC discussed and encouraged STS, cue feeding, signs of proper latch and milk transfer and hand expression with use as nipple "cream" after feeding babies.  LC encouraged review of Baby and Me pp 9, 14 and 20-25 for STS and BF information. LC provided LC Resource brochure and reviewed WH services and list of community and web site resources.    Maternal Data Formula Feeding for Exclusion: No Infant to breast within first hour of birth: No Breastfeeding delayed due to:: Maternal status Has patient been taught Hand Expression?: Yes Does the patient have breastfeeding experience prior to this delivery?: No  Feeding Feeding Type: Breast Fed Length of feed: 30 min (LC observed baby well-latched at end of feeding)  LATCH Score/Interventions      Observed "A" well-latched                Lactation Tools Discussed/Used  STS, cue feedings, hand expression and nipple care q3h feedings as minimum for preterm/small babies   Consult Status Consult Status: Follow-up Date: 01/05/14 Follow-up type: In-patient    Tawana Pasch Parmly 01/04/2014, 9:39 PM    

## 2014-01-04 NOTE — Progress Notes (Signed)
OB PN  S: Patient resting comfortably with epidural.  O: BP 105/58  Pulse 67  Temp(Src) 98.1 F (36.7 C) (Oral)  Resp 16  Ht 5\' 8"  (1.727 m)  Wt 73.483 kg (162 lb)  BMI 24.64 kg/m2  SpO2 100%  LMP 04/14/2013  FHT: Twin A: 145, moderate variability, no accels, no decels Twin B: 135, moderate variability, no accels, no decels Toco: q2-614min, SVE: per RN @ 0630: 4/90/-2  CBC    Component Value Date/Time   WBC 9.2 01/03/2014 2025   RBC 3.54* 01/03/2014 2025   HGB 11.9* 01/03/2014 2025   HCT 34.9* 01/03/2014 2025   PLT 210 01/03/2014 2025   MCV 98.6 01/03/2014 2025   MCH 33.6 01/03/2014 2025   MCHC 34.1 01/03/2014 2025   RDW 12.9 01/03/2014 2025    A/P: A/P: 33 y.o. G1P0 @ 7089w6d with di/di twins- transitioning to active labor. -FWB- Cat. I reassuring x 2 -IOL: s/p cytotec x 1, SROM, continue Pit per protocol -GBS prophylaxis: continue PCN per protocol -Pain management: epidural in place  Myna HidalgoJennifer Jamisen Duerson, DO [phone]) [phone] (office)

## 2014-01-04 NOTE — Progress Notes (Signed)
Patient still has epidural catheter in. Called Dr. Brayton CavesFreeman Jackson at 2130, who said it is okay to d/c epidural as long as okay with OB MD. Paged Dr. Charlotta Newtonzan at 2138. Order received. Will continue to monitor.

## 2014-01-04 NOTE — Anesthesia Postprocedure Evaluation (Signed)
Anesthesia Post Note  Patient: Cheryl Bailey  Procedure(s) Performed: * No procedures listed *  Anesthesia type: Epidural  Patient location: Mother/Baby  Post pain: Pain level controlled  Post assessment: Post-op Vital signs reviewed  Last Vitals:  Filed Vitals:   01/04/14 1637  BP: 107/62  Pulse: 84  Temp: 36.9 C  Resp: 18    Post vital signs: Reviewed  Level of consciousness:alert  Complications: No apparent anesthesia complications

## 2014-01-04 NOTE — Progress Notes (Signed)
Pt states she is dizzy when sitting up.  VSS.  No new bleeding.

## 2014-01-04 NOTE — Progress Notes (Signed)
Pt states she is dizzy when she sits up.  VSS.  No new bleeding.  Will have pt eat regular diet before attempting to get up out of bed.

## 2014-01-05 ENCOUNTER — Encounter (HOSPITAL_COMMUNITY): Payer: Self-pay | Admitting: Obstetrics & Gynecology

## 2014-01-05 LAB — CBC
HEMATOCRIT: 20 % — AB (ref 36.0–46.0)
HEMOGLOBIN: 7 g/dL — AB (ref 12.0–15.0)
MCH: 34.3 pg — AB (ref 26.0–34.0)
MCHC: 35 g/dL (ref 30.0–36.0)
MCV: 98 fL (ref 78.0–100.0)
Platelets: 155 10*3/uL (ref 150–400)
RBC: 2.04 MIL/uL — AB (ref 3.87–5.11)
RDW: 12.8 % (ref 11.5–15.5)
WBC: 16.3 10*3/uL — ABNORMAL HIGH (ref 4.0–10.5)

## 2014-01-05 LAB — RPR: RPR Ser Ql: NONREACTIVE

## 2014-01-05 MED ORDER — FERROUS GLUCONATE 324 (38 FE) MG PO TABS
324.0000 mg | ORAL_TABLET | Freq: Three times a day (TID) | ORAL | Status: DC
  Administered 2014-01-05 – 2014-01-06 (×3): 324 mg via ORAL
  Filled 2014-01-05 (×4): qty 1

## 2014-01-05 MED ORDER — OXYCODONE-ACETAMINOPHEN 5-325 MG PO TABS: 1.0000 | ORAL_TABLET | Freq: Four times a day (QID) | ORAL | Status: DC | PRN

## 2014-01-05 MED ORDER — IBUPROFEN 600 MG PO TABS: 600.0000 mg | ORAL_TABLET | Freq: Four times a day (QID) | ORAL | Status: DC

## 2014-01-05 NOTE — Lactation Note (Signed)
This note was copied from the chart of Cheryl Davionna Eckmann. Lactation Consultation Note: Baby B wakes easily and latched well but sleepy and non nutritive at the breast after a few minutes. Reviewed waking techniques with mom- stimulation to keep baby awake and nursing. Encouraged to try to feed no more that 20- 30 minutes to conserve calories. To feed EBM and/ or formula as needed. Attempted to nurse both babies at the same time but baby A would not latch. No further questions at present. To call for assist prn.  Patient Name: Cheryl Bailey Today's Date: 01/05/2014 Reason for consult: Follow-up assessment   Maternal Data    Feeding Feeding Type: Breast Fed Length of feed: 30 min  LATCH Score/Interventions Latch: Grasps breast easily, tongue down, lips flanged, rhythmical sucking.  Audible Swallowing: None  Type of Nipple: Everted at rest and after stimulation  Comfort (Breast/Nipple): Soft / non-tender     Hold (Positioning): Assistance needed to correctly position infant at breast and maintain latch. Intervention(s): Breastfeeding basics reviewed;Support Pillows;Position options  LATCH Score: 7  Lactation Tools Discussed/Used Pump Review: Setup, frequency, and cleaning Initiated by:: DW Date initiated:: 01/05/14   Consult Status Consult Status: Follow-up Date: 01/06/14 Follow-up type: In-patient    Warner Laduca D 01/05/2014, 3:54 PM    

## 2014-01-05 NOTE — Progress Notes (Signed)
Postpartum Note Day # 1  S:  Patient resting comfortable in bed.  Pain controlled.  Tolerating general diet. + flatus, no BM.  Lochia moderate.  Ambulating without difficulty.  Foley removed this am, voiding freely.  She denies n/v/f/c, SOB, or CP.  Pt plans on breastfeeding.  O: Temp:  [98.3 F (36.8 C)-98.9 F (37.2 C)] 98.7 F (37.1 C) (02/25 2015) Pulse Rate:  [53-205] 108 (02/26 1030) Resp:  [12-24] 20 (02/26 1030) BP: (79-120)/(29-74) 108/63 mmHg (02/26 1030) SpO2:  [98 %-100 %] 98 % (02/25 2015) UOP: 1300cc/overnight 8hr  Gen: A&Ox3, NAD CV: RRR, no tachycardia appreciated  Resp: CTAB Abdomen: soft, NT, ND Uterus: firm, non-tender, below umbilicus Ext: No edema, no calf tenderness bilaterally  Labs:  CBC    Component Value Date/Time   WBC 16.3* 01/05/2014 0600   RBC 2.04* 01/05/2014 0600   HGB 7.0* 01/05/2014 0600   HCT 20.0* 01/05/2014 0600   PLT 155 01/05/2014 0600   MCV 98.0 01/05/2014 0600   MCH 34.3* 01/05/2014 0600   MCHC 35.0 01/05/2014 0600   RDW 12.8 01/05/2014 0600    A/P: Pt is a 33 y.o. G1P1002 s/p VAVD x2 of di/di twins complicated by PPH.  -PPH: Total EBL: 1000cc, Hgb drop for 9.2--> 7.  Pt is currently asymptomatic.  VSS and UOP adequate.  Continue iron 3x daily and po hydration.  Will hold off on transfusion at this time. - Pain well controlled -GU: UOP is adequate -GI: Tolerating general diet -Activity: encouraged sitting up to chair and ambulation as tolerated -Prophylaxis: early ambulation -Pt breastfeeding without difficulty -Plan for discharge tmr  Myna HidalgoJennifer Shaundra Fullam, DO [phone]) [phone] (office)

## 2014-01-05 NOTE — Lactation Note (Signed)
This note was copied from the chart of GIRLA Meaghan Roker. Lactation Consultation Note: Mom reports that baby B is nursing better. Baby A sleepy at the breast- would not latch. Mom had pumped 3 cc's of Colostrum- assisted grandmother with spoon feeding. Encouraged her to let baby take milk- not pour it in. Reviewed setup, use and cleaning of DEBP. Mom is [employer] employee and will get pump from us before DC. Mom pleased to see some Colostrum. No questions at present. To call for assist prn  Patient Name: Mliss FritzGIRLA Margarett Mccartt IONGE'XToday's Date: 01/05/2014 Reason for consult: Follow-up assessment   Maternal Data    Feeding Feeding Type: Breast Fed Length of feed: 0 min  LATCH Score/Interventions Latch: Too sleepy or reluctant, no latch achieved, no sucking elicited.  Audible Swallowing: None  Type of Nipple: Everted at rest and after stimulation  Comfort (Breast/Nipple): Soft / non-tender     Hold (Positioning): Assistance needed to correctly position infant at breast and maintain latch.  LATCH Score: 5  Lactation Tools Discussed/Used WIC Program: No Pump Review: Setup, frequency, and cleaning Initiated by:: DW Date initiated:: 01/05/14   Consult Status Consult Status: Follow-up Date: 01/06/14 Follow-up type: In-patient    Pamelia HoitWeeks, Evalyse Stroope D 01/05/2014, 3:48 PM

## 2014-01-06 ENCOUNTER — Ambulatory Visit (HOSPITAL_COMMUNITY): Payer: 59

## 2014-01-06 LAB — TYPE AND SCREEN
ABO/RH(D): B POS
Antibody Screen: NEGATIVE
UNIT DIVISION: 0
UNIT DIVISION: 0

## 2014-01-06 NOTE — Discharge Instructions (Signed)
Vaginal Delivery Care After Refer to this sheet in the next few weeks. These discharge instructions provide you with information on caring for yourself after delivery. Your caregiver may also give you specific instructions. Your treatment has been planned according to the most current medical practices available, but problems sometimes occur. Call your caregiver if you have any problems or questions after you go home. HOME CARE INSTRUCTIONS  For pain: Percocet for severe pain- otherwise over the counter motrin or tylenol as needed.  For anemia: Take integra at least twice daily.  To prevent constipation: Continue with Colace twice daily and preparation H.  Do not drink alcohol, especially if you are breastfeeding or taking medicine to relieve pain.  Do not chew or smoke tobacco.  Do not use illegal drugs.  Continue to use good perineal care. Good perineal care includes:  Wiping your perineum from front to back.  Keeping your perineum clean.  Do not use tampons or douche until your caregiver says it is okay.  Shower, wash your hair, and take tub baths as directed by your caregiver.  Wear a well-fitting bra that provides breast support.  Eat healthy foods.  Drink enough fluids to keep your urine clear or pale yellow.  Eat high-fiber foods such as whole grain cereals and breads, brown rice, beans, and fresh fruits and vegetables every day. These foods may help prevent or relieve constipation.  Follow your cargiver's recommendations regarding resumption of activities such as climbing stairs, driving, lifting, exercising, or traveling.  Talk to your caregiver about resuming sexual activities. Resumption of sexual activities is dependent upon your risk of infection, your rate of healing, and your comfort and desire to resume sexual activity.  Try to have someone help you with your household activities and your newborn for at least a few days after you leave the hospital.  Rest as  much as possible. Try to rest or take a nap when your newborn is sleeping.  Increase your activities gradually.  Keep all of your scheduled postpartum appointments. It is very important to keep your scheduled follow-up appointments. At these appointments, your caregiver will be checking to make sure that you are healing physically and emotionally. SEEK MEDICAL CARE IF:   You are passing large clots from your vagina. Save any clots to show your caregiver.  You have a foul smelling discharge from your vagina.  You have trouble urinating.  You are urinating frequently.  You have pain when you urinate.  You have a change in your bowel movements.  You have increasing redness, pain, or swelling near your vaginal incision (episiotomy) or vaginal tear.  You have pus draining from your episiotomy or vaginal tear.  Your episiotomy or vaginal tear is separating.  You have painful, hard, or reddened breasts.  You have a severe headache.  You have blurred vision or see spots.  You feel sad or depressed.  You have thoughts of hurting yourself or your newborn.  You have questions about your care, the care of your newborn, or medicines.  You are dizzy or lightheaded.  You have a rash.  You have nausea or vomiting.  You were breastfeeding and have not had a menstrual period within 12 weeks after you stopped breastfeeding.  You are not breastfeeding and have not had a menstrual period by the 12th week after delivery.  You have a fever. SEEK IMMEDIATE MEDICAL CARE IF:   You have persistent pain.  You have chest pain.  You have shortness of breath.  You faint.  You have leg pain.  You have stomach pain.  Your vaginal bleeding saturates two or more sanitary pads in 1 hour. MAKE SURE YOU:   Understand these instructions.  Will watch your condition.  Will get help right away if you are not doing well or get worse. Document Released: 10/24/2000 Document Revised:  07/21/2012 Document Reviewed: 06/23/2012 [facility] Patient Information 2014 [place].

## 2014-01-06 NOTE — Discharge Summary (Signed)
Obstetric Discharge Summary Reason for Admission: induction of labor Date of Admission: 01/03/14 Date of Discharge: 01/06/14 Prenatal Procedures: BPP and ultrasound Intrapartum Procedures: vacuumx2 Postpartum Procedures: none Complications-Operative and Postpartum: 2nd degree perineal laceration and hemorrhage Hemoglobin  Date Value Ref Range Status  01/05/2014 7.0* 12.0 - 15.0 g/dL Final     REPEATED TO VERIFY     DELTA CHECK NOTED     HCT  Date Value Ref Range Status  01/05/2014 20.0* 36.0 - 46.0 % Final    Physical Exam:  General: alert, cooperative and no distress Lochia: appropriate Uterine Fundus: firm DVT Evaluation: No evidence of DVT seen on physical exam.  Discharge Diagnoses: Term Pregnancy-delivered Discharge Information: Date: 01/06/2014  Hospital Course:   33 y/o G1P0 @ 75w5dwith IVF di/di twins (as dated by LMP c/w 9wk week ultrasound) presented for Induction of labor. The patient has been followed by antepartum surveillance including serial growth and modified BPP. A growth UKoreaperformed @ 323w1dhowed Twin A with EFW of 21% with lagging AC of <3%. Per MFM recommendations- plan was made to continue with modified BPP and dopplers weekly with plan for delivery at 37-38 weeks.  Pregnancy complicated by:  1) Di/Di twins with concern for S<D in Twin A  -intracardiac echogenic focus (LV) of both tiwns  -antepartum surveillance as mentioned above. Last performed on 2/19: both twins with BPP 8/8 and normal dopplers. Last growth 2/6 @ 3558w1dwin A- vertex (maternal left), EFW: 2106gm, 4#10oz (21%) with AC <3%, normal doppler, Twin B- variable vertex/transverse, EFW: 2271g, 5# (33%), normal doppler, Discordance: 7%  2) Anemia in pregnancy- on iron daily  IOL was started with cytotec. She spontaneously ruptured- clear fluid and was started on PCN for GBS prophylaxis. Induction was continued with Pitocin and she received an epidural for pain. The patient progressed to complete  dilation, an FSE was placed as the tracing was intermittent. The patient was brought to the OR for delivery.  Due to non-reassuring FHT at +3 station, Kiwi vacuum was applied- one pull was used, VAVD without complications of Twin A. Attention was turned to Twin B-membranes intact, with compound presentation: oblique fetal head with hand palpated. With internal manipulation, Twin B was brought vertex, AROM performed- clear fluid. Due to difficulty tracking FHT- FSE was placed on Twin B. Heart tones were noted to be in the 50s. Kiwi vacuum was applied to Twin B. Two pulls with one pop off, VAVD without complications. A second degree laceration was repaired in the usual fashion. Cervix was examined- no lacerations noted.  Following delivery during her recovery period, the patient was noted to have a PPHClaremontth passage of large clots and syncopal episode due to hypotension.  (Total EBL 1000cc)  Pt was given IV fluids, ephedrine and quickly recovered.  Uterus was firm, below the umbilicus with minimal bleeding.  Hgb noted to be 9.5 from 11.9 with stable vital signs and adequate UOP. No additional uterotonics or blood transfusion were needed.  Pt did well postpartum and met all milestones appropriately.  She was discharged home in stable condition, postpartum Day #2.      Activity: pelvic rest Diet: routine Medications: PNV, Ibuprofen, Colace, Iron and Percocet Condition: stable Instructions: refer to practice specific booklet Discharge to: home Follow-up Information   Follow up with OZAJanyth Pupa, DO In 6 weeks.   Specialty:  Obstetrics and Gynecology   Contact information:   [address] [phone]  Newborn Data:   Nailani, Full [[id number]]  Live born female  Birth Weight: 5 lb 6.2 oz (2445 g) APGAR: 7, 8   Nakiya, Rallis [[id number]]  Live born female  Birth Weight: 5 lb 9.1 oz (2525 g) APGAR: 7, 8  Home with  mother.  Janyth Pupa, M 01/06/2014, 10:08 AM

## 2014-01-06 NOTE — Progress Notes (Signed)
Postpartum Note Day # 2  S:  Patient up ambulating without difficulty this am.  Pain controlled.  Tolerating general diet. + flatus, no BM.  Lochia minimal.  Voiding freely.  She denies n/v/f/c, SOB, or CP.  Breastfeeding without difficulty.  O: Temp:  [98.4 F (36.9 C)-98.7 F (37.1 C)] 98.7 F (37.1 C) (02/27 0600) Pulse Rate:  [101-108] 101 (02/27 0600) Resp:  [18-20] 18 (02/27 0600) BP: (103-108)/(63-69) 106/67 mmHg (02/27 0600) SpO2:  [97 %] 97 % (02/27 0600)  Gen: A&Ox3, NAD CV: RRR, no tachycardia appreciated  Resp: CTAB Abdomen: soft, NT, ND Uterus: firm, non-tender, below umbilicus Ext: No edema, no calf tenderness bilaterally  Labs:  CBC    Component Value Date/Time   WBC 16.3* 01/05/2014 0600   RBC 2.04* 01/05/2014 0600   HGB 7.0* 01/05/2014 0600   HCT 20.0* 01/05/2014 0600   PLT 155 01/05/2014 0600   MCV 98.0 01/05/2014 0600   MCH 34.3* 01/05/2014 0600   MCHC 35.0 01/05/2014 0600   RDW 12.8 01/05/2014 0600    A/P: Pt is a 33 y.o. G1P1002 s/p VAVD x2 of di/di twins complicated by PPH.  PPD #2  -PPH: Total EBL: 1000cc, Hgb drop for 9.2--> 7.  Pt is currently asymptomatic and doing well.  VSS and UOP adequate.  Reports occasional tachycardia with increased activity, but overall feels fine.  Continue iron 3x daily and po hydration.  - Pain well controlled -GU: UOP is adequate -GI: Tolerating general diet -Activity: encouraged sitting up to chair and ambulation as tolerated -Prophylaxis: early ambulation Plan for discharge home today with follow up in 6 weeks.  Advised patient that once at home if she notices dizziness, headache or heart palpitation to notify the office immediately.  Myna HidalgoJennifer Fannie Alomar, DO [phone] (pager) [phone] (office)

## 2014-01-07 ENCOUNTER — Ambulatory Visit: Payer: Self-pay

## 2014-01-07 NOTE — Lactation Note (Signed)
This note was copied from the chart of GirlA Gloriajean Zartman.     Lactation Consultation Note  Patient Name: Cheryl FritzGirlA Cheryl Bailey ZOXWR'UToday's Date: 01/07/2014   Visited with Mom and FOB on day of discharge.  Babies are 5171 hrs old. To go home on phototherapy with follow up Monday with Pediatrician.  Babys both getting bottle supplement following time at the breast.  Babies to be fed at least every 3 hrs.  Encouraged this along with regular double pumping every 3 hrs for 15-20 mins.  Explained importance of pumping both breasts at the same time with regards to elevating her prolactin levels.  (Mom states she pumped for an hour last evening, one breast at a time) Mom has a Medela PIS Advanced at home. Encouraged continued supplementation until her OP lactation appointment for a Feeding Assessment on March 6th.  Volume parameters discussed using 22 cal formula until milk volume increasing.  To call our office prn  Maternal Data    Feeding    LATCH Score/Interventions                      Lactation Tools Discussed/Used     Consult Status      Cheryl Bailey, Cheryl Bailey 01/07/2014, 10:58 AM

## 2014-01-13 ENCOUNTER — Ambulatory Visit (HOSPITAL_COMMUNITY): Payer: 59

## 2014-01-23 ENCOUNTER — Ambulatory Visit (HOSPITAL_COMMUNITY): Payer: 59

## 2014-01-23 ENCOUNTER — Inpatient Hospital Stay (HOSPITAL_COMMUNITY)
Admission: RE | Admit: 2014-01-23 | Discharge: 2014-01-23 | Disposition: A | Discharge status: Home / Self Care | Payer: 59 | Source: Ambulatory Visit | Attending: Obstetrics & Gynecology | Admitting: Obstetrics & Gynecology

## 2014-01-23 NOTE — Lactation Note (Signed)
Adult Lactation Consultation Outpatient Visit Note  Patient Name: Cheryl Bailey                                    38 week Twins, 2 weeks and 64  days old Date of Birth: Apr 07, 1981                                                          "Verlon Au" Baby A :Weight today 6-9.8, 3000                        "Lily" Baby B: Weight today , [phone] Gestational Age at Delivery: Unknown Type of Delivery: vaginal delivery  Breastfeeding History: Frequency of Breastfeeding: Lily breastfeeds every 2-3 hours, Verlon Au has been a poor feeder. She is mostly bottle feeding . May breastfeed once daily Length of Feeding: 15-30 mins Voids: 8-10 both girlsStools: Leslie, stools 4-5 times daily , Lily stools 1-2 times daily   Supplementing / Method: Verlon Au takes 2 ounces of EBM with a bottle every 2-3 hours, Lily takes 3 ounces if she gets a bottle Pumping:  Type of Pump:Medela Pump N Style advanced   Frequency:4-5 times daily   Volume:  8-9 ounces  Comments:Twins are 2 weeks and 5 days. Mother was scheduled for a follow up visit on day of discharge. Mother is coming in today with engorgement. Her breast are very swollen and full. She denies having any tenderness on her nipple tissue when infants are breastfeeding. Nipple tissue intact. Mother states that [facility] B is a very good feeder. Birdena Crandall A is a poor feeder    Consultation Evaluation: Warm towels applied to mothers breast to soften tissue before breast feeding. Hand expressed small amts of milk.    "Verlon Au" Verlon Au was placed at (L) breast in football hold. Mother was taught breast compression. SNS starter sat up to entice  Iago to be latch better. Infant began feeding after taking 5 ml of EBM. Assist mother with breast compression. Leslie sustained latch for 15-20 mins. She transferred 29 ml. Total transfer was 34 ml.  Infant had a wet and dirty diaper.  Assist mother with latching her back onto same breast. She sustained latch with good  breast compression for 15 mins and transferred another 14 ml   Initial Feeding Assessment: Pre-feed Weight:3000 Post-feed OZHYQM:5784 Amount Transferred:34 m Comments:  Additional Feeding Assessment: Pre-feed Weight:3024 Post-feed ONGEXB:2841 Amount Transferred:14 ml Comments:FOB gave infant 25- 30 ml of ebm with a bottle Total feeding : 75-78 ml    "Lily"  Lily was placed in  cradle hold on (R) breast. Assist mother with latch on and a intermittent breast compression. [place]onna [place] does sustain breast well and needs very little stimulation. Observed good burst of rhythmic suckling and audible swallows. Infant sustained latch for 20 mins and transferred 66ml  Additional Feeding Assessment: Pre-feed Weight:3152 Post-feed Weight:3218 Amount Transferred:46ml Comments: recommend to offer addition EBM as needed after feedings.   Assist mother with post pumping and resolving engorgement. Mothers breast much softer after 25 mins of pumping and warm compresses. Mother pumped approximately 240 ml.    Additional Interventions:  Advised mother to continue to cue base feed. Recommend that she start  offering breast to [place] with each feeding Continue to supplement Verlon AuLeslie after each feeding. Offer at least 30 ml after breastfeeding. Encouraged mother to do breast compression when feeding May use SNS to entice Verlon AuLeslie to feed Recommend to offer supplement to [place] as needed.  Advised mother to hand express before and after breastfeeding and pumping.  Mother to massage breast well to prevent future engorgement Recommend that mother drain her breast well at least once daily and all other pumping's, pump for 15-20 mins. Recommend decreasing pumping in the night only for comfort.  Mother to nap frequently and to drink to thirst Follow up with LC in one week for feeding assessment.    Follow-Up   March 31 at 2:30 and at 4pm   Stevan BornKendrick, Shana Younge [facility] 01/23/2014, 4:49 PM

## 2014-02-07 ENCOUNTER — Ambulatory Visit (HOSPITAL_COMMUNITY): Admission: RE | Admit: 2014-02-07 | Payer: 59 | Source: Ambulatory Visit

## 2014-02-07 ENCOUNTER — Ambulatory Visit (HOSPITAL_COMMUNITY): Payer: 59

## 2014-09-11 ENCOUNTER — Encounter (HOSPITAL_COMMUNITY): Payer: Self-pay | Admitting: Obstetrics & Gynecology

## 2014-10-27 IMAGING — US US UA CORD DOPPLER
1 series · 12 of 20 positions shown · non-contrast
Comparison: none

[Series 1: us ua cord doppler · 0.28mm/px · 12 of 20 slices shown]
[im 1/20]
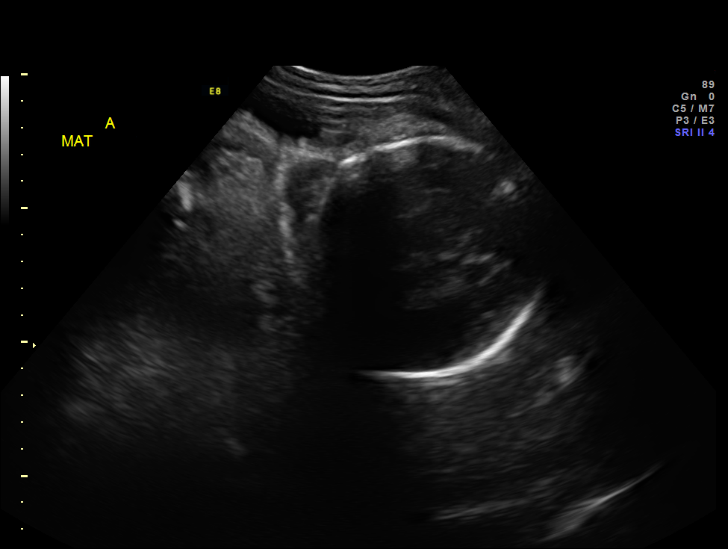
[im 3/20]
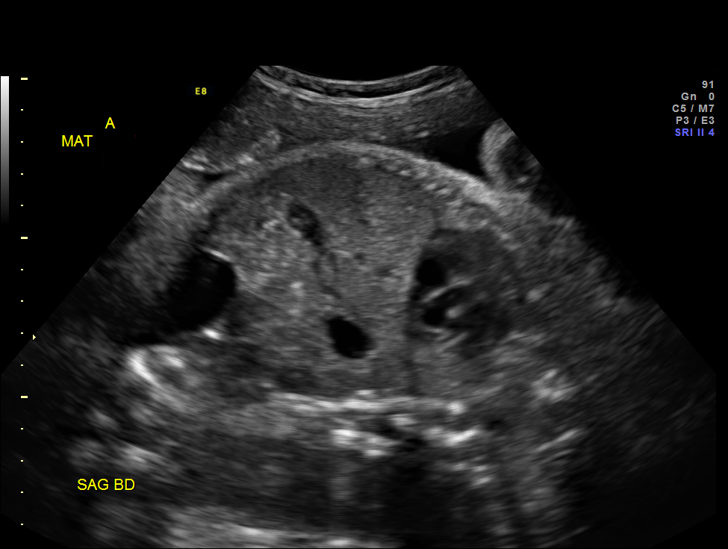
[im 5/20]
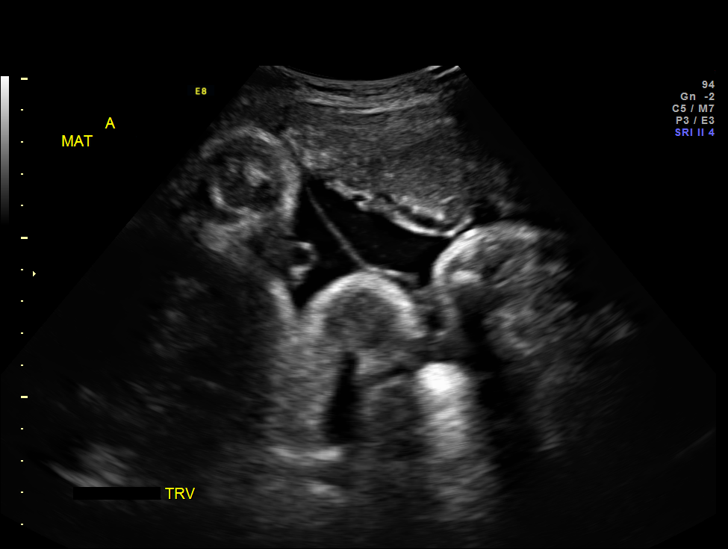
[im 6/20]
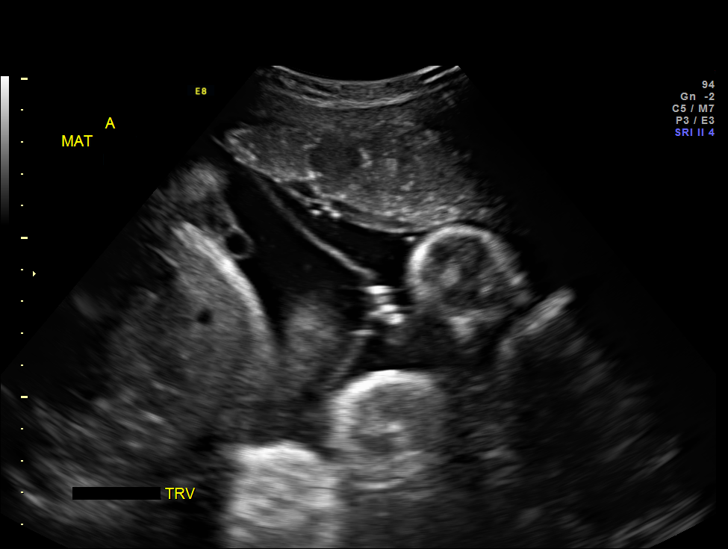
[im 8/20]
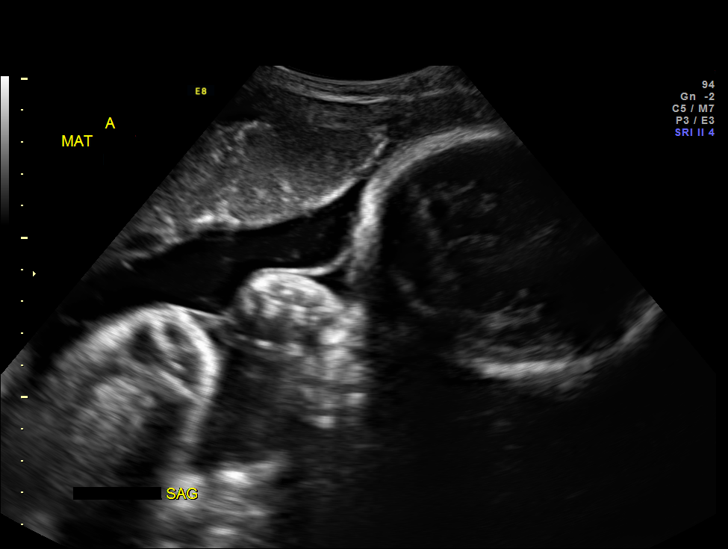
[im 10/20]
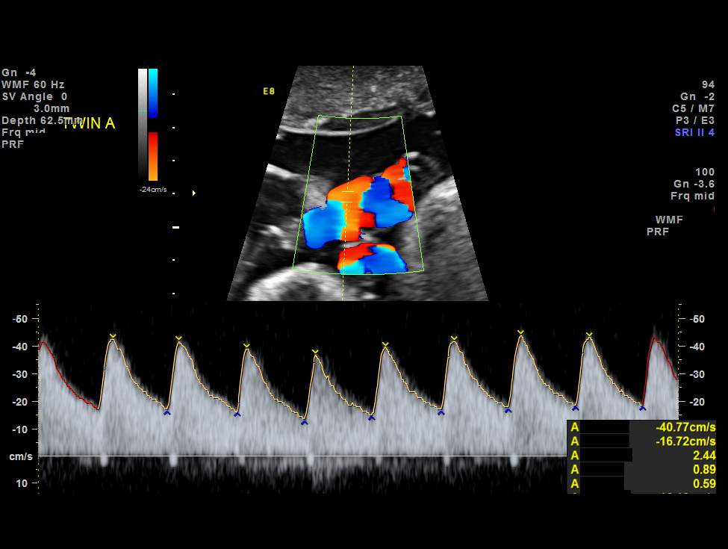
[im 11/20]
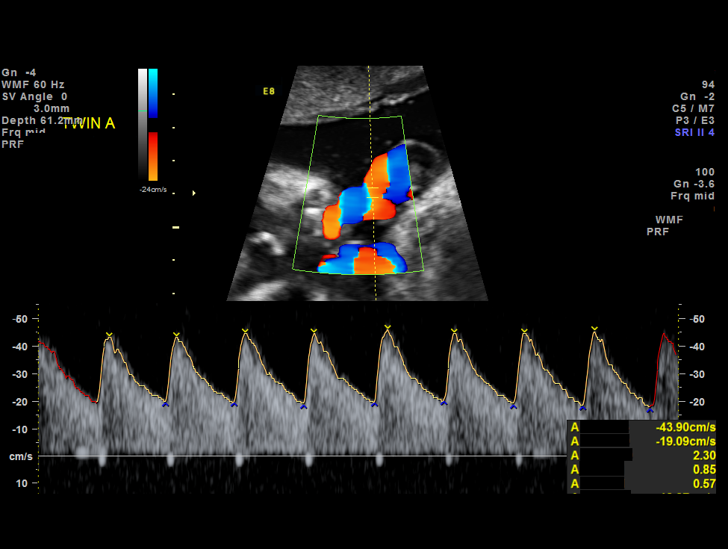
[im 13/20]
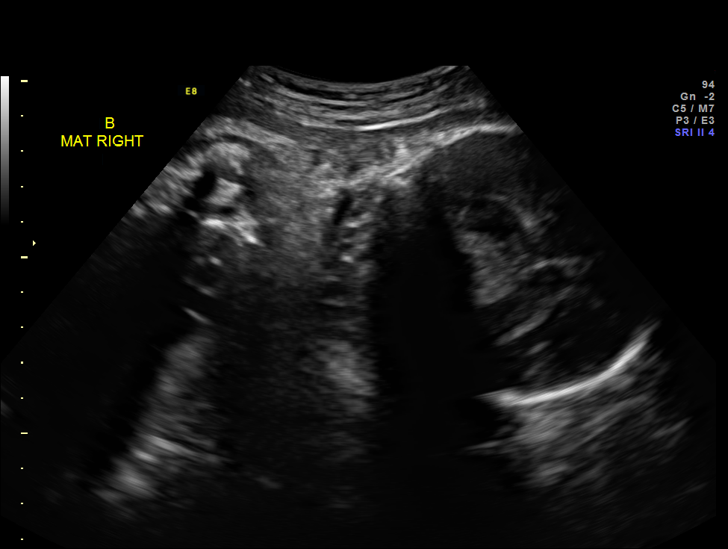
[im 15/20]
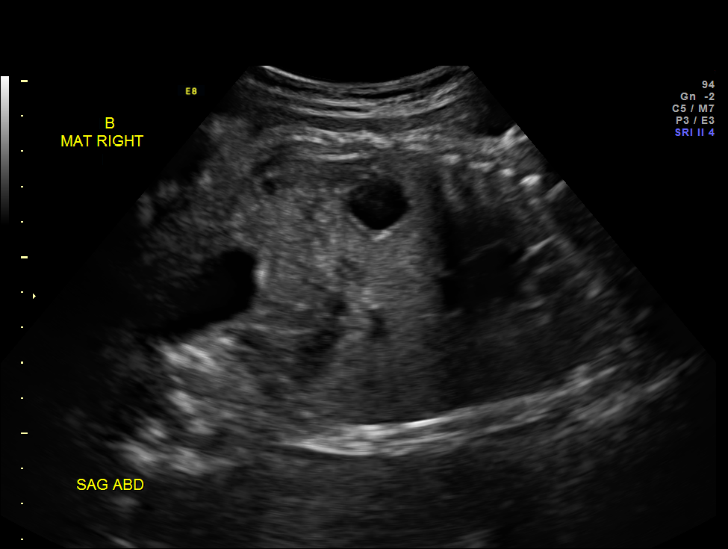
[im 16/20]
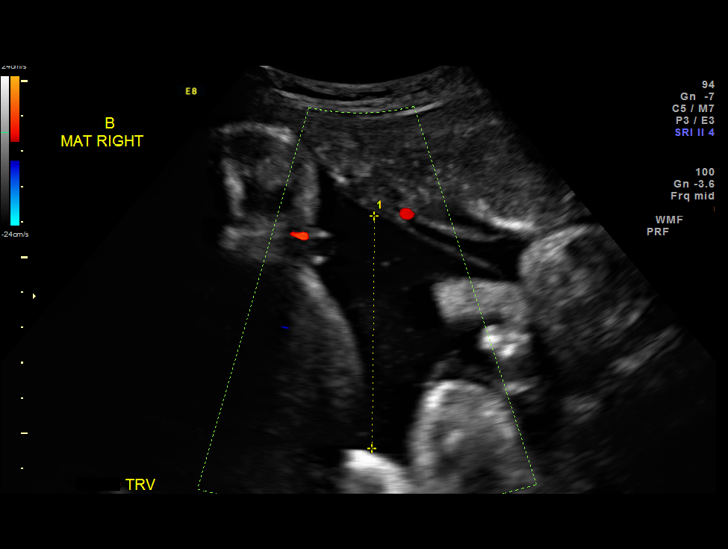
[im 18/20]
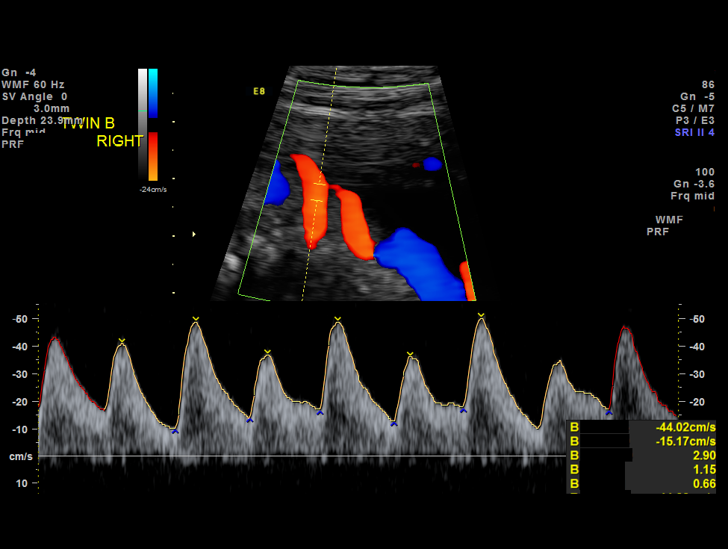
[im 20/20]
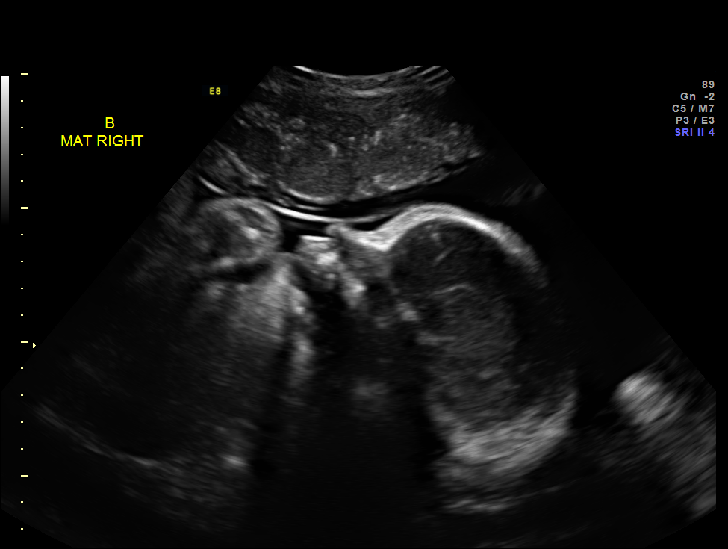

[12 of 20 positions shown; findings below may reference images not displayed]

OBSTETRICS REPORT
                      (Signed Final 12/22/2013 [DATE])

Service(s) Provided

 US UA CORD DOPPLER                                    76820.0
 US UA ADDL GEST                                       76820.1
Indications

 Twin gestation, Di-Di
 Pregnancy resulting from assisted reproductive
 technology (IVF)
 Size less than dates (Small for gestational [AGE]
 FGR) - Twin A (AC<3%)
 Echogenic intracardiac focus of the heart  (EIF) -
 Twin B
Fetal Evaluation (Fetus A)

 Num Of Fetuses:    2
 Fetal Heart Rate:  147                          bpm
 Cardiac Activity:  Observed
 Fetal Lie:         Left Fetus
 Presentation:      Cephalic
 Placenta:          Anterior, above cervical os
 P. Cord            Previously Visualized
 Insertion:

 Membrane Desc:     Dividing
                    Membrane seen
                    - Dichorionic.

 Amniotic Fluid
 AFI FV:      Subjectively within normal limits
                                             Larg Pckt:     4.0  cm
Biophysical Evaluation (Fetus A)

 Amniotic F.V:   Pocket => 2 cm two         F. Tone:        Observed
                 planes
 F. Movement:    Observed                   Score:          [DATE]
 F. Breathing:   Observed
Gestational Age (Fetus A)

 LMP:           36w 0d        Date:  04/14/13                 EDD:   01/19/14
 Best:          36w 0d     Det. By:  LMP  (04/14/13)          EDD:   01/19/14
Doppler - Fetal Vessels (Fetus A)

 Umbilical Artery
 S/D:   2.42           51  %tile       RI:
 PI:    0.89                           PSV:       43.9    cm/s
 Umbilical Artery
 Absent DFV:    No     Reverse DFV:    No

Fetal Evaluation (Fetus B)

 Num Of Fetuses:    2
 Fetal Heart Rate:  138                          bpm
 Cardiac Activity:  Observed
 Fetal Lie:         Right Fetus
 Presentation:      Transverse, head to
                    maternal left
 Placenta:          Posterior, above cervical
                    os

 Membrane Desc:     Dividing
                    Membrane seen
                    - Dichorionic.

 Amniotic Fluid
 AFI FV:      Subjectively within normal limits
                                             Larg Pckt:     6.6  cm
Gestational Age (Fetus B)

 LMP:           36w 0d        Date:  04/14/13                 EDD:   01/19/14
 Best:          36w 0d     Det. By:  LMP  (04/14/13)          EDD:   01/19/14
Doppler - Fetal Vessels (Fetus B)

 Umbilical Artery
 S/D:   2.65           62  %tile       RI:
 PI:    1.02                           PSV:       44      cm/s
 Umbilical Artery
 Absent DFV:    No     Reverse DFV:    No

Impression

 Dichorionic/diamniotic twin pregnancy at 36 0/7 weeks
 Follow up due to lagging growth (Twin A with AC < 3rd %tile)

 Twin A:
 Maternal left, cephalic, anterior placenta
 BPP [DATE]
 UA Doppler studies normal for gestational age
 Normal amniotic fluid volume

 Twin B:
 Maternal right, transverse presentation with fetal head to the
 maternal left, posterior placenta
 Normal interval anatomy
 BPP [DATE]
 UA Doppler studies normal for gestaional age
 Normal amniotic fluid volume
Recommendations

 Recommend weekly BPP with UA Dopplers
 If testing otherwise remains stable, recommend delivery at 37-
 38 weeks gestation

## 2014-11-10 NOTE — L&D Delivery Note (Signed)
Delivery Note 33yo [id number]@G2P1002@ 3747w3d who presented for IOL due to full term gestation.  Pregnancy complicated by: 1) Anemia in pregnancy- on iron daily, last Hgb 9.9 2) Size less than dates: US @ 36wk: vertex/posterior/EFW: 6#10oz (67%)  Induction was started with Cytotec and continued with Pitocin and foley balloon.  She received an epidural for pain management progressed to complete dilation.  At 7:50 PM a viable female was delivered via Vaginal, Spontaneous Delivery (Presentation: Left Occiput Anterior).  APGAR: 8, 9; weight  .   Placenta status: Intact, Spontaneous.  Cord: 3 vessels with the following complications: None.   Anesthesia: Epidural  Episiotomy: None Lacerations: 1st degree Suture Repair: 2.0 3.0 vicryl and 4-0 vicryl interrupted for subclitoral laceration Est. Blood Loss (mL): 450  Mom to postpartum.  Baby to Couplet care / Skin to Skin.  Myna HidalgoZAN, Sorina Derrig, M 09/04/2015, 8:40 PM

## 2015-02-16 LAB — OB RESULTS CONSOLE GC/CHLAMYDIA
Chlamydia: NEGATIVE
Gonorrhea: NEGATIVE

## 2015-02-16 LAB — OB RESULTS CONSOLE RUBELLA ANTIBODY, IGM
RUBELLA: NON-IMMUNE/NOT IMMUNE
Rubella: IMMUNE

## 2015-02-16 LAB — OB RESULTS CONSOLE ABO/RH: RH TYPE: POSITIVE

## 2015-02-16 LAB — OB RESULTS CONSOLE HEPATITIS B SURFACE ANTIGEN: Hepatitis B Surface Ag: NEGATIVE

## 2015-02-16 LAB — OB RESULTS CONSOLE HIV ANTIBODY (ROUTINE TESTING): HIV: NONREACTIVE

## 2015-02-16 LAB — OB RESULTS CONSOLE ANTIBODY SCREEN: Antibody Screen: NEGATIVE

## 2015-02-16 LAB — OB RESULTS CONSOLE RPR: RPR: NONREACTIVE

## 2015-08-01 LAB — OB RESULTS CONSOLE GBS: GBS: NEGATIVE

## 2015-08-30 ENCOUNTER — Encounter (HOSPITAL_COMMUNITY): Payer: Self-pay | Admitting: *Deleted

## 2015-08-30 ENCOUNTER — Telehealth (HOSPITAL_COMMUNITY): Payer: Self-pay | Admitting: *Deleted

## 2015-08-30 NOTE — Telephone Encounter (Signed)
Preadmission screen  

## 2015-09-03 NOTE — H&P (Signed)
HPI: 34 y/o G2P1002 @ 6134w3d estimated gestational age (as dated by LMP c/w 20 week ultrasound) presents for IOL due to late term gestation.   no Leaking of Fluid,   no Vaginal Bleeding,   no Uterine Contractions,  + Fetal Movement.  ROS: no HA, no epigastric pain, no visual changes.    Pregnancy complicated by: 1) Anemia in pregnancy- on iron daily, last Hgb 9.9 2) Size less than dates: US @ 36wk: vertex/posterior/EFW: 6#10oz (67%)   Prenatal Transfer Tool  Maternal Diabetes: No Genetic Screening: Declined Maternal Ultrasounds/Referrals: Normal Fetal Ultrasounds or other Referrals:  None Maternal Substance Abuse:  No Significant Maternal Medications:  None Significant Maternal Lab Results: Lab values include: Group B Strep negative   PNL:  GBS neg, Rub Immune, Hep B neg, RPR NR, HIV neg, GC/C neg, glucola:normal Hgb 9.9 (05/31/15) Blood type: A positive Immunizations: Tdap: 08/13/15  OBHx: @ 38wks: VAVD x2- twin girls, 5#6-5#9oz, uncomplicated PMHx:  none Meds:  PNV, iron Allergy:  No Known Allergies SurgHx: none SocHx:   no Tobacco, no  EtOH, no Illicit Drugs  O: LMP 11/25/2014 Examination performed in office Gen. AAOx3, NAD CV.  RRR  No murmur.  Resp. CTAB, no wheeze or crackles. Abd. Gravid,  no tenderness Extr.  no edema B/L , no calf tenderness  FHT: 140 by doppler SVE: closed/25/-3, soft   Labs: see orders  A/P:  34 y.o. G2P1002 @ 4534w3d EGA who presents for IOL due to full term gestation -FWB:  Reassuring by doppler -Labor: plan for cytotec per protocol -GBS: negative -Pain management: IV medication or epidural upon request -CBC, T&S on arrival  Myna HidalgoJennifer Serene Kopf, DO [phone] (pager) [phone] (office)

## 2015-09-04 ENCOUNTER — Inpatient Hospital Stay (HOSPITAL_COMMUNITY)
Admission: RE | Admit: 2015-09-04 | Discharge: 2015-09-05 | DRG: 775 | Disposition: A | Discharge status: Home / Self Care | Payer: 59 | Source: Ambulatory Visit | Attending: Obstetrics & Gynecology | Admitting: Obstetrics & Gynecology

## 2015-09-04 ENCOUNTER — Inpatient Hospital Stay (HOSPITAL_COMMUNITY): Payer: 59 | Admitting: Anesthesiology

## 2015-09-04 ENCOUNTER — Encounter (HOSPITAL_COMMUNITY): Payer: Self-pay

## 2015-09-04 DIAGNOSIS — Z3A40 40 weeks gestation of pregnancy: Secondary | ICD-10-CM | POA: Diagnosis not present

## 2015-09-04 DIAGNOSIS — Z3490 Encounter for supervision of normal pregnancy, unspecified, unspecified trimester: Secondary | ICD-10-CM

## 2015-09-04 DIAGNOSIS — O480 Post-term pregnancy: Principal | ICD-10-CM | POA: Diagnosis present

## 2015-09-04 DIAGNOSIS — D649 Anemia, unspecified: Secondary | ICD-10-CM | POA: Diagnosis present

## 2015-09-04 DIAGNOSIS — O26843 Uterine size-date discrepancy, third trimester: Secondary | ICD-10-CM | POA: Diagnosis present

## 2015-09-04 DIAGNOSIS — O9902 Anemia complicating childbirth: Secondary | ICD-10-CM | POA: Diagnosis present

## 2015-09-04 LAB — CBC
HEMATOCRIT: 31.4 % — AB (ref 36.0–46.0)
HEMOGLOBIN: 10.7 g/dL — AB (ref 12.0–15.0)
MCH: 34.2 pg — ABNORMAL HIGH (ref 26.0–34.0)
MCHC: 34.1 g/dL (ref 30.0–36.0)
MCV: 100.3 fL — AB (ref 78.0–100.0)
Platelets: 212 10*3/uL (ref 150–400)
RBC: 3.13 MIL/uL — ABNORMAL LOW (ref 3.87–5.11)
RDW: 13.5 % (ref 11.5–15.5)
WBC: 10.4 10*3/uL (ref 4.0–10.5)

## 2015-09-04 LAB — RPR: RPR Ser Ql: NONREACTIVE

## 2015-09-04 LAB — TYPE AND SCREEN
ABO/RH(D): B POS
ANTIBODY SCREEN: NEGATIVE

## 2015-09-04 MED ORDER — MISOPROSTOL 25 MCG QUARTER TABLET
25.0000 ug | ORAL_TABLET | ORAL | Status: DC
  Administered 2015-09-04: 25 ug via VAGINAL
  Filled 2015-09-04: qty 0.25

## 2015-09-04 MED ORDER — EPHEDRINE 5 MG/ML INJ
10.0000 mg | INTRAVENOUS | Status: DC | PRN
  Filled 2015-09-04: qty 2

## 2015-09-04 MED ORDER — ONDANSETRON HCL 4 MG PO TABS: 4.0000 mg | ORAL_TABLET | ORAL | Status: DC | PRN

## 2015-09-04 MED ORDER — ACETAMINOPHEN 325 MG PO TABS: 650.0000 mg | ORAL_TABLET | ORAL | Status: DC | PRN

## 2015-09-04 MED ORDER — OXYCODONE-ACETAMINOPHEN 5-325 MG PO TABS: 2.0000 | ORAL_TABLET | ORAL | Status: DC | PRN

## 2015-09-04 MED ORDER — ZOLPIDEM TARTRATE 5 MG PO TABS: 5.0000 mg | ORAL_TABLET | Freq: Every evening | ORAL | Status: DC | PRN

## 2015-09-04 MED ORDER — LACTATED RINGERS IV SOLN
500.0000 mL | INTRAVENOUS | Status: DC | PRN
  Administered 2015-09-04 (×3): 500 mL via INTRAVENOUS

## 2015-09-04 MED ORDER — LANOLIN HYDROUS EX OINT: TOPICAL_OINTMENT | CUTANEOUS | Status: DC | PRN

## 2015-09-04 MED ORDER — NALBUPHINE HCL 10 MG/ML IJ SOLN
10.0000 mg | INTRAMUSCULAR | Status: DC | PRN
  Administered 2015-09-04: 10 mg via INTRAVENOUS
  Filled 2015-09-04: qty 1

## 2015-09-04 MED ORDER — IBUPROFEN 600 MG PO TABS
600.0000 mg | ORAL_TABLET | Freq: Four times a day (QID) | ORAL | Status: DC
  Administered 2015-09-05 (×4): 600 mg via ORAL
  Filled 2015-09-04 (×4): qty 1

## 2015-09-04 MED ORDER — FENTANYL 2.5 MCG/ML BUPIVACAINE 1/10 % EPIDURAL INFUSION (WH - ANES)
14.0000 mL/h | INTRAMUSCULAR | Status: DC | PRN
  Administered 2015-09-04 (×2): 14 mL/h via EPIDURAL
  Filled 2015-09-04: qty 125

## 2015-09-04 MED ORDER — PHENYLEPHRINE 40 MCG/ML (10ML) SYRINGE FOR IV PUSH (FOR BLOOD PRESSURE SUPPORT)
80.0000 ug | PREFILLED_SYRINGE | INTRAVENOUS | Status: DC | PRN
  Filled 2015-09-04: qty 20
  Filled 2015-09-04: qty 2

## 2015-09-04 MED ORDER — DIBUCAINE 1 % RE OINT
1.0000 "application " | TOPICAL_OINTMENT | RECTAL | Status: DC | PRN
  Administered 2015-09-05: 1 via RECTAL
  Filled 2015-09-04: qty 28

## 2015-09-04 MED ORDER — DIPHENHYDRAMINE HCL 25 MG PO CAPS: 25.0000 mg | ORAL_CAPSULE | Freq: Four times a day (QID) | ORAL | Status: DC | PRN

## 2015-09-04 MED ORDER — MISOPROSTOL 25 MCG QUARTER TABLET
25.0000 ug | ORAL_TABLET | ORAL | Status: AC
  Administered 2015-09-04: 25 ug via ORAL
  Filled 2015-09-04: qty 0.25

## 2015-09-04 MED ORDER — ONDANSETRON HCL 4 MG/2ML IJ SOLN: 4.0000 mg | Freq: Four times a day (QID) | INTRAMUSCULAR | Status: DC | PRN

## 2015-09-04 MED ORDER — ONDANSETRON HCL 4 MG/2ML IJ SOLN: 4.0000 mg | INTRAMUSCULAR | Status: DC | PRN

## 2015-09-04 MED ORDER — OXYTOCIN 40 UNITS IN LACTATED RINGERS INFUSION - SIMPLE MED
1.0000 m[IU]/min | INTRAVENOUS | Status: DC
  Administered 2015-09-04: 6 m[IU]/min via INTRAVENOUS
  Administered 2015-09-04: 2 m[IU]/min via INTRAVENOUS
  Filled 2015-09-04: qty 1000

## 2015-09-04 MED ORDER — DIPHENHYDRAMINE HCL 50 MG/ML IJ SOLN: 12.5000 mg | INTRAMUSCULAR | Status: DC | PRN

## 2015-09-04 MED ORDER — LIDOCAINE HCL (PF) 1 % IJ SOLN
INTRAMUSCULAR | Status: DC | PRN
  Administered 2015-09-04 (×2): 3 mL via EPIDURAL

## 2015-09-04 MED ORDER — CITRIC ACID-SODIUM CITRATE 334-500 MG/5ML PO SOLN: 30.0000 mL | ORAL | Status: DC | PRN

## 2015-09-04 MED ORDER — PRENATAL MULTIVITAMIN CH
1.0000 | ORAL_TABLET | Freq: Every day | ORAL | Status: DC
  Administered 2015-09-05: 1 via ORAL
  Filled 2015-09-04: qty 1

## 2015-09-04 MED ORDER — TERBUTALINE SULFATE 1 MG/ML IJ SOLN
0.2500 mg | Freq: Once | INTRAMUSCULAR | Status: DC | PRN
Start: 2015-09-04 — End: 2015-09-04
  Filled 2015-09-04: qty 1

## 2015-09-04 MED ORDER — PE-SHARK LIVER OIL-COCOA BUTTR 0.25-3-85.5 % RE SUPP: 1.0000 | RECTAL | Status: DC | PRN

## 2015-09-04 MED ORDER — WITCH HAZEL-GLYCERIN EX PADS
1.0000 "application " | MEDICATED_PAD | CUTANEOUS | Status: DC | PRN
  Administered 2015-09-05: 1 via TOPICAL

## 2015-09-04 MED ORDER — TERBUTALINE SULFATE 1 MG/ML IJ SOLN
0.2500 mg | Freq: Once | INTRAMUSCULAR | Status: DC | PRN
  Filled 2015-09-04: qty 1

## 2015-09-04 MED ORDER — SENNOSIDES-DOCUSATE SODIUM 8.6-50 MG PO TABS
2.0000 | ORAL_TABLET | ORAL | Status: DC
  Administered 2015-09-05: 2 via ORAL
  Filled 2015-09-04: qty 2

## 2015-09-04 MED ORDER — LACTATED RINGERS IV SOLN
INTRAVENOUS | Status: DC
  Administered 2015-09-04 (×4): via INTRAVENOUS

## 2015-09-04 MED ORDER — OXYCODONE-ACETAMINOPHEN 5-325 MG PO TABS: 1.0000 | ORAL_TABLET | ORAL | Status: DC | PRN

## 2015-09-04 MED ORDER — LIDOCAINE HCL (PF) 1 % IJ SOLN
30.0000 mL | INTRAMUSCULAR | Status: DC | PRN
  Filled 2015-09-04: qty 30

## 2015-09-04 MED ORDER — OXYTOCIN BOLUS FROM INFUSION
500.0000 mL | INTRAVENOUS | Status: DC
  Administered 2015-09-04: 500 mL via INTRAVENOUS

## 2015-09-04 MED ORDER — OXYTOCIN 40 UNITS IN LACTATED RINGERS INFUSION - SIMPLE MED: 62.5000 mL/h | INTRAVENOUS | Status: DC

## 2015-09-04 MED ORDER — OXYCODONE-ACETAMINOPHEN 5-325 MG PO TABS
1.0000 | ORAL_TABLET | ORAL | Status: DC | PRN
  Administered 2015-09-05: 1 via ORAL
  Filled 2015-09-04: qty 1

## 2015-09-04 MED ORDER — SIMETHICONE 80 MG PO CHEW
80.0000 mg | CHEWABLE_TABLET | ORAL | Status: DC | PRN
  Filled 2015-09-04: qty 1

## 2015-09-04 MED ORDER — BENZOCAINE-MENTHOL 20-0.5 % EX AERO
1.0000 | INHALATION_SPRAY | CUTANEOUS | Status: DC | PRN
Start: 2015-09-04 — End: 2015-09-06

## 2015-09-04 NOTE — Anesthesia Preprocedure Evaluation (Addendum)
Anesthesia Evaluation  Patient identified by MRN, date of birth, ID band Patient awake    Reviewed: Allergy & Precautions, NPO status , Patient's Chart, lab work & pertinent test results  Airway Mallampati: I  TM Distance: >3 FB Neck ROM: Full    Dental  (+) Teeth Intact   Pulmonary neg pulmonary ROS,    breath sounds clear to auscultation       Cardiovascular negative cardio ROS   Rhythm:Regular Rate:Normal     Neuro/Psych negative neurological ROS  negative psych ROS   GI/Hepatic negative GI ROS, Neg liver ROS,   Endo/Other  negative endocrine ROS  Renal/GU negative Renal ROS  negative genitourinary   Musculoskeletal negative musculoskeletal ROS (+)   Abdominal   Peds negative pediatric ROS (+)  Hematology negative hematology ROS (+)   Anesthesia Other Findings   Reproductive/Obstetrics (+) Pregnancy                            Lab Results  Component Value Date   WBC 10.4 09/04/2015   HGB 10.7* 09/04/2015   HCT 31.4* 09/04/2015   MCV 100.3* 09/04/2015   PLT 212 09/04/2015   No results found for: CREATININE, BUN, NA, K, CL, CO2 No results found for: INR, PROTIME   Anesthesia Physical Anesthesia Plan  ASA: II  Anesthesia Plan: Epidural   Post-op Pain Management:    Induction:   Airway Management Planned:   Additional Equipment:   Intra-op Plan:   Post-operative Plan:   Informed Consent: I have reviewed the patients History and Physical, chart, labs and discussed the procedure including the risks, benefits and alternatives for the proposed anesthesia with the patient or authorized representative who has indicated his/her understanding and acceptance.     Plan Discussed with:   Anesthesia Plan Comments:         Anesthesia Quick Evaluation

## 2015-09-04 NOTE — Progress Notes (Signed)
OB PN:  S: Pt feeling much better with epidural  O: BP 105/58 mmHg  Pulse 70  Temp(Src) 98.1 F (36.7 C) (Oral)  Resp 18  Ht 5\' 8"  (1.727 m)  Wt 73.12 kg (161 lb 3.2 oz)  BMI 24.52 kg/m2  SpO2 99%  LMP 11/25/2014  FHT: 135bpm, moderate variablity, +10x10 accels, no decels Toco: q2-304min SVE: 2-3/70/-2, Foley placed with AROM- clear fluid  A/P: 34 y.o. G2P1002 @ 4111w3d who presents for IOL for full term gestation 1. FWB: Cat. I 2. Labor: continue with Pit per protocol, consider IUPC if no further cervical dilation or contractions difficult to monitor Pain: continue with epidural GBS: negative  Myna HidalgoJennifer Johncarlos Holtsclaw, DO [phone] (pager) [phone] (office)

## 2015-09-04 NOTE — Progress Notes (Signed)
OB PN:  S: Pt resting comfortably, no acute complaints  O: BP 107/63 mmHg  Pulse 87  Temp(Src) 98.7 F (37.1 C) (Oral)  Resp 18  Ht 5\' 8"  (1.727 m)  Wt 73.12 kg (161 lb 3.2 oz)  BMI 24.52 kg/m2  LMP 11/25/2014  FHT: 130bpm, moderate variablity, +10x10 accels, no decels Toco: irregular SVE: deferred, cytotec placed @ 0400  A/P: 34 y.o. G2P1002 @ 338w3d who presents for IOL for full term gestation 1. FWB: Cat. I 2. Labor: continue with cytotec,plan for recheck @ 8am, if still closed additional dose of cytotec.  Otherwise will transition to Pitocin Pain: IV or CLE upon request GBS: negative  Myna HidalgoJennifer Burtis Imhoff, DO [phone]) [phone] (office)

## 2015-09-04 NOTE — Progress Notes (Signed)
Pt arrived to L&D for a schedule IOL for PD.  At approximately 0130 the primary nurse called to report she was uncomfortable placing cytotec.  Strip reviewed, repeatative variable decels noted.  FHR 155, moderate variability, occasional ctx with uterine irritability.  Dr Sallye OberKulwa called and updates. Instruction to nurse to hold cytotec and to give IV bolus.  At approximate 0245 the primary nurse call to report occasional variable decel, an increase in baseline to 160-165 con't uterine irribility.  Instruction to the nurse to change the toco and con't to hold the cytotec. FHR 160, moderate variability, + accel, occasional variable decel with increase uterine irritability.  At 0310 Dr Sallye OberKulwa was called to review the strip.  Per Dr Sallye OberKulwa, wait and monitor the strip for another hour.  If strip remains the same and irritability calm down, ok to give cytotec if not call back to update.  At, FHR 145-150, moderate variability, + accel, occasional variable decel.  Instruction to nurse to check pt and give cytotec.

## 2015-09-04 NOTE — Anesthesia Procedure Notes (Signed)
Epidural Patient location during procedure: OB Start time: 09/04/2015 4:32 PM End time: 09/04/2015 4:39 PM  Staffing Anesthesiologist: Shona SimpsonHOLLIS, Cheryl Ressel D Performed by: anesthesiologist   Preanesthetic Checklist Completed: patient identified, site marked, surgical consent, pre-op evaluation, timeout performed, IV checked, risks and benefits discussed and monitors and equipment checked  Epidural Patient position: sitting Prep: DuraPrep Patient monitoring: heart rate, continuous pulse ox and blood pressure Approach: midline Location: L4-L5 Injection technique: LOR saline  Needle:  Needle type: Tuohy  Needle gauge: 18 G Needle length: 9 cm and 9 Catheter type: closed end flexible Catheter size: 20 Guage Test dose: negative and Other  Assessment Events: blood not aspirated, injection not painful, no injection resistance, negative IV test and no paresthesia  Additional Notes LOR @ 5  Patient identified. Risks/Benefits/Options discussed with patient including but not limited to bleeding, infection, nerve damage, paralysis, failed block, incomplete pain control, headache, blood pressure changes, nausea, vomiting, reactions to medications, itching and postpartum back pain. Confirmed with bedside nurse the patient's most recent platelet count. Confirmed with patient that they are not currently taking any anticoagulation, have any bleeding history or any family history of bleeding disorders. Patient expressed understanding and wished to proceed. All questions were answered. Sterile technique was used throughout the entire procedure. Please see nursing notes for vital signs. Test dose was given through epidural catheter and negative prior to continuing to dose epidural or start infusion. Warning signs of high block given to the patient including shortness of breath, tingling/numbness in hands, complete motor block, or any concerning symptoms with instructions to call for help. Patient was given  instructions on fall risk and not to get out of bed. All questions and concerns addressed with instructions to call with any issues or inadequate analgesia.      Patient tolerated the insertion well without complications.Reason for block:procedure for pain

## 2015-09-04 NOTE — Progress Notes (Signed)
Patient ID: Cheryl Bailey, female   DOB: 03/25/1981, 34 y.o.   MRN: [id number]   Late progress note entry for events at 3.30 am: Date 09/04/15.   I reviewed EFM with CNM Venus Standard.   Upon admission EFM has shown baseline of 160, minimal variability, intermittent variable decelerations. TOCO had shown irregular contractions initially then marked irritability. Patient did not have any complaints then per CNM, but baby was noted to be very active. Patient received IV fluid bolus and positional changes and EFM then showed baseline of 150, moderate variability, 10 x 10 accelerations, no decelerations. TOCO showed some irritability. Plan was made for cytotec at 4 am as patient now had category 1 tracing and TOCO showed no contractions. CNM to see patient at bedside at 4 am.  Dr. Sallye OberKulwa.

## 2015-09-04 NOTE — Progress Notes (Signed)
OB PN:  S: Pt resting comfortably, no acute complaints  O: BP 103/56 mmHg  Pulse 87  Temp(Src) 98.5 F (36.9 C) (Oral)  Resp 20  Ht 5\' 8"  (1.727 m)  Wt 73.12 kg (161 lb 3.2 oz)  BMI 24.52 kg/m2  LMP 11/25/2014  FHT: 135bpm, moderate variablity, +10x10 accels, no decels Toco: irregular SVE: 1/25/-3  A/P: 34 y.o. G2P1002 @ 1179w3d who presents for IOL for full term gestation 1. FWB: Cat. I 2. Labor: plan for Pit per protocol, unable to place Foley balloon due to pt discomfort Pain: IV or CLE upon request GBS: negative  Myna HidalgoJennifer Dinna Severs, DO [phone] (pager) [phone] (office)

## 2015-09-05 LAB — CBC
HEMATOCRIT: 25.2 % — AB (ref 36.0–46.0)
HEMOGLOBIN: 8.8 g/dL — AB (ref 12.0–15.0)
MCH: 35.2 pg — ABNORMAL HIGH (ref 26.0–34.0)
MCHC: 34.9 g/dL (ref 30.0–36.0)
MCV: 100.8 fL — ABNORMAL HIGH (ref 78.0–100.0)
Platelets: 172 10*3/uL (ref 150–400)
RBC: 2.5 MIL/uL — AB (ref 3.87–5.11)
RDW: 13.4 % (ref 11.5–15.5)
WBC: 13.1 10*3/uL — AB (ref 4.0–10.5)

## 2015-09-05 MED ORDER — IBUPROFEN 600 MG PO TABS: 600.0000 mg | ORAL_TABLET | Freq: Four times a day (QID) | ORAL | Status: DC

## 2015-09-05 MED ORDER — FERROUS SULFATE 325 (65 FE) MG PO TABS
325.0000 mg | ORAL_TABLET | Freq: Two times a day (BID) | ORAL | Status: DC
  Administered 2015-09-05 (×2): 325 mg via ORAL
  Filled 2015-09-05 (×2): qty 1

## 2015-09-05 MED ORDER — FERROUS GLUCONATE 324 (38 FE) MG PO TABS: 324.0000 mg | ORAL_TABLET | Freq: Every day | ORAL | Status: DC

## 2015-09-05 NOTE — Progress Notes (Signed)
Postpartum Note Day # 1  S:  Patient resting comfortable in bed.  Pain controlled.  Tolerating general. Lochia moderate.  Ambulating without difficulty.  She denies n/v/f/c, SOB, or CP.  Pt plans on breastfeeding.  Doing very well and reports no acute complaints.  O: Temp:  [97.3 F (36.3 C)-98.7 F (37.1 C)] 98.7 F (37.1 C) (10/26 0345) Pulse Rate:  [65-106] 96 (10/26 0345) Resp:  [18-20] 18 (10/26 0345) BP: (78-137)/(30-79) 121/66 mmHg (10/26 0345) SpO2:  [97 %-100 %] 100 % (10/26 0345)   Gen: A&Ox3, NAD CV: RRR, no MRG Resp: CTAB Abdomen: soft, NT, ND Uterus: firm, non-tender, below umbilicus Ext: No edema, no calf tenderness bilaterally  Labs:  CBC Latest Ref Rng 09/05/2015 09/04/2015 01/05/2014  WBC 4.0 - 10.5 K/uL 13.1(H) 10.4 16.3(H)  Hemoglobin 12.0 - 15.0 g/dL 1.6(X8.8(L) 10.7(L) 7.0(L)  Hematocrit 36.0 - 46.0 % 25.2(L) 31.4(L) 20.0(L)  Platelets 150 - 400 K/uL 172 212 155   A/P: Pt is a [age] y.o. G2P2003 s/p NSVD, PPD#1  - Pain well controlled -GU: UOP is adequate -GI: Tolerating general diet -Activity: encouraged sitting up to chair and ambulation as tolerated -Prophylaxis: early ambulation -Labs: Appropriate decline in Hgb for recent delivery, pt to continue with iron twice daily  Meeting postpartum milestones appropriately and desires early discharge later this evening.  If discharged by PEDS will plan for discharge home today.  Myna HidalgoJennifer Haleem Hanner, DO [phone] (pager) [phone] (office)

## 2015-09-05 NOTE — Discharge Instructions (Signed)

## 2015-09-05 NOTE — Anesthesia Postprocedure Evaluation (Signed)
Anesthesia Post Note  Patient: Cheryl Bailey  Procedure(s) Performed: * No procedures listed *  Anesthesia type: Epidural  Patient location: Mother/Baby  Post pain: Pain level controlled  Post assessment: Post-op Vital signs reviewed  Last Vitals:  Filed Vitals:   09/05/15 0345  BP: 121/66  Pulse: 96  Temp: 37.1 C  Resp: 18    Post vital signs: Reviewed  Level of consciousness: awake  Complications: No apparent anesthesia complications

## 2015-09-10 NOTE — Discharge Summary (Signed)
OB Discharge Summary     Patient Name: Cheryl Bailey DOB: 03/11/1981 MRN: [id number]  Date of admission: 09/04/2015 Delivering MD: Myna HidalgoZAN, Raseel Jans   Date of discharge: 09/05/2015  Admitting diagnosis: INDUCTION Intrauterine pregnancy: 1987w3d     Secondary diagnosis:  Active Problems:   Intrauterine normal pregnancy  Additional problems: Anemia in pregnancy     Discharge diagnosis: Term Pregnancy Delivered and Anemia                                                                                                Post partum procedures:none  Augmentation: AROM, Pitocin, Cytotec and Foley Balloon  Complications: None  Hospital course:  Induction of Labor With Vaginal Delivery   34 y.o. yo G2P1002 at 1587w3d was admitted to the hospital 09/04/2015 for induction of labor.  Indication for induction: Postdates.   Pregnancy complicated by: 1) Anemia in pregnancy- on iron daily, last Hgb 9.9 2) Size less than dates: US @ 36wk: vertex/posterior/EFW: 6#10oz (67%)  Patient had uncomplicated labor course- induced with cytotec, followed by Pit and Foley balloon.  She received an epdiural for pain management.  Membrane Rupture Time/Date: 5:36 PM ,09/04/2015   Intrapartum Procedures: Episiotomy: None [1]                                         Lacerations:  1st degree [2]  Patient had delivery of a Viable infant.  Information for the patient's newborn:  Lalla Brothersmokpae, Lisa Eghosa [[id number]][[id number]]  Delivery Method: Vaginal, Spontaneous Delivery (Filed from Delivery Summary)   09/04/2015  Details of delivery can be found in separate delivery note.  Patient had a routine postpartum course. Patient is discharged home 09/05/2015 10:30 PM    Physical exam  Filed Vitals:   09/04/15 2245 09/05/15 0345 09/05/15 1030 09/05/15 1909  BP: 112/60 121/66 99/59 104/58  Pulse: 77 96 73 86  Temp: 98.4 F (36.9 C) 98.7 F (37.1 C) 98.3 F (36.8 C) 98.1 F (36.7 C)  TempSrc: Oral Oral Oral Oral  Resp: 18  18 16 16   Height:      Weight:      SpO2: 100% 100%  100%   General: alert, cooperative and no distress Lochia: appropriate Uterine Fundus: firm, non-tender, below umbilicus Incision: N/A DVT Evaluation: No evidence of DVT seen on physical exam. Labs: Lab Results  Component Value Date   WBC 13.1* 09/05/2015   HGB 8.8* 09/05/2015   HCT 25.2* 09/05/2015   MCV 100.8* 09/05/2015   PLT 172 09/05/2015   No flowsheet data found.  Discharge instruction: per After Visit Summary and "Baby and Me Booklet".  Medications: No current facility-administered medications for this encounter.  Current outpatient prescriptions:  .  acetaminophen (TYLENOL) 500 MG tablet, Take 500 mg by mouth every 6 (six) hours as needed for headache., Disp: , Rfl:  .  b complex vitamins tablet, Take 1 tablet by mouth daily., Disp: , Rfl:  .  folic acid (FOLVITE) 400 MCG tablet, Take 400 mcg  by mouth daily., Disp: , Rfl:  .  polyethylene glycol (MIRALAX / GLYCOLAX) packet, Take 17 g by mouth daily as needed for mild constipation., Disp: , Rfl:  .  Prenatal Vit-Fe Fumarate-FA (MULTIVITAMIN-PRENATAL) 27-0.8 MG TABS, Take 1 tablet by mouth daily at 12 noon., Disp: , Rfl:  .  ferrous gluconate (FERGON) 324 MG tablet, Take 1 tablet (324 mg total) by mouth daily with breakfast., Disp: 30 tablet, Rfl: 3 .  ibuprofen (ADVIL,MOTRIN) 600 MG tablet, Take 1 tablet (600 mg total) by mouth every 6 (six) hours., Disp: 30 tablet, Rfl: 0 .  shark liver oil-cocoa butter (PREPARATION H) 0.25-3-85.5 % suppository, Place 1 suppository rectally as needed for hemorrhoids., Disp: , Rfl:  After visit meds:    Medication List    TAKE these medications        acetaminophen 500 MG tablet  Commonly known as:  TYLENOL  Take 500 mg by mouth every 6 (six) hours as needed for headache.     b complex vitamins tablet  Take 1 tablet by mouth daily.     ferrous gluconate 324 MG tablet  Commonly known as:  FERGON  Take 1 tablet (324 mg total)  by mouth daily with breakfast.     folic acid 400 MCG tablet  Commonly known as:  FOLVITE  Take 400 mcg by mouth daily.     ibuprofen 600 MG tablet  Commonly known as:  ADVIL,MOTRIN  Take 1 tablet (600 mg total) by mouth every 6 (six) hours.     multivitamin-prenatal 27-0.8 MG Tabs tablet  Take 1 tablet by mouth daily at 12 noon.     polyethylene glycol packet  Commonly known as:  MIRALAX / GLYCOLAX  Take 17 g by mouth daily as needed for mild constipation.     PREPARATION H 0.25-3-85.5 % suppository  Generic drug:  shark liver oil-cocoa butter  Place 1 suppository rectally as needed for hemorrhoids.        Diet: routine diet  Activity: Advance as tolerated. Pelvic rest for 6 weeks.   Outpatient follow up:6 weeks Follow up Appt:No future appointments. Follow up Visit:No Follow-up on file.  Postpartum contraception: Undecided  Newborn Data: Live born female  Birth Weight: 7 lb 13.8 oz (3565 g) APGAR: 8, 9  Baby Feeding: Breast Disposition:home with mother   09/10/2015 Myna Hidalgo, M, DO

## 2016-01-04 ENCOUNTER — Other Ambulatory Visit: Payer: Self-pay | Admitting: Obstetrics & Gynecology

## 2016-01-04 ENCOUNTER — Other Ambulatory Visit (HOSPITAL_COMMUNITY)
Admission: RE | Admit: 2016-01-04 | Discharge: 2016-01-04 | Disposition: A | Discharge status: Home / Self Care | Payer: 59 | Source: Ambulatory Visit | Attending: Obstetrics and Gynecology | Admitting: Obstetrics and Gynecology

## 2016-01-04 DIAGNOSIS — Z01419 Encounter for gynecological examination (general) (routine) without abnormal findings: Secondary | ICD-10-CM | POA: Insufficient documentation

## 2016-01-04 DIAGNOSIS — Z124 Encounter for screening for malignant neoplasm of cervix: Secondary | ICD-10-CM | POA: Diagnosis not present

## 2016-01-04 DIAGNOSIS — Z30431 Encounter for routine checking of intrauterine contraceptive device: Secondary | ICD-10-CM | POA: Diagnosis not present

## 2016-01-07 LAB — CYTOLOGY - PAP

## 2017-05-12 HISTORY — PX: BREAST ENHANCEMENT SURGERY: SHX7

## 2017-05-12 HISTORY — PX: AUGMENTATION MAMMAPLASTY: SUR837

## 2018-01-13 ENCOUNTER — Encounter: Payer: 59 | Admitting: Obstetrics and Gynecology

## 2018-01-14 ENCOUNTER — Ambulatory Visit (INDEPENDENT_AMBULATORY_CARE_PROVIDER_SITE_OTHER): Payer: 59 | Admitting: Obstetrics & Gynecology

## 2018-01-14 ENCOUNTER — Encounter: Payer: Self-pay | Admitting: Obstetrics & Gynecology

## 2018-01-14 VITALS — BP 120/68 | HR 60 | Ht 68.0 in | Wt 131.9 lb

## 2018-01-14 DIAGNOSIS — Z124 Encounter for screening for malignant neoplasm of cervix: Secondary | ICD-10-CM | POA: Diagnosis not present

## 2018-01-14 DIAGNOSIS — Z1151 Encounter for screening for human papillomavirus (HPV): Secondary | ICD-10-CM | POA: Diagnosis not present

## 2018-01-14 DIAGNOSIS — L700 Acne vulgaris: Secondary | ICD-10-CM | POA: Diagnosis not present

## 2018-01-14 DIAGNOSIS — Z01419 Encounter for gynecological examination (general) (routine) without abnormal findings: Secondary | ICD-10-CM

## 2018-01-14 MED ORDER — TRETINOIN 0.025 % EX CREA: TOPICAL_CREAM | Freq: Every day | CUTANEOUS | 5 refills | Status: DC

## 2018-01-14 MED ORDER — BENZOYL PEROXIDE-ERYTHROMYCIN 5-3 % EX GEL: Freq: Two times a day (BID) | CUTANEOUS | 5 refills | Status: DC

## 2018-01-14 NOTE — Progress Notes (Signed)
GYNECOLOGY ANNUAL PREVENTATIVE CARE ENCOUNTER NOTE  Subjective:   Leni Wendall Stade Lammert is a 37 y.o. 562P2003 female here for a routine annual gynecologic exam and to establish care.  Current complaints: none gynecologic, desires refill of acne medication.   Denies abnormal vaginal bleeding, discharge, pelvic pain, problems with intercourse or other gynecologic concerns.    Gynecologic History Patient's last menstrual period was 01/10/2018 (exact date). Contraception: IUD placed after last delivery in 2015 Last Pap: 2017. Results were: normal  Obstetric History OB History  Gravida Para Term Preterm AB Living  2 2 2  0 0 3  SAB TAB Ectopic Multiple Live Births  0 0 0 1 3    # Outcome Date GA Lbr Len/2nd Weight Sex Delivery Anes PTL Lv  2 Term 09/04/15 2234w3d 06:41 / 00:10 7 lb 13.8 oz (3.565 kg) F Vag-Spont EPI  LIV  1A Term 01/04/14 2924w6d / 01:03 5 lb 6.2 oz (2.445 kg) F Vag-Vacuum EPI  LIV  1B Term 01/04/14 3024w6d / 01:27 5 lb 9.1 oz (2.525 kg) F Vag-Vacuum EPI  LIV      Past Medical History:  Diagnosis Date  . Breast mass    L breast 2 oclock, mobile and nontender    Past Surgical History:  Procedure Laterality Date  . BREAST ENHANCEMENT SURGERY  05/12/2017  . VAGINAL DELIVERY N/A 01/04/2014   Procedure: VAGINAL DELIVERY;  Surgeon: Sharon SellerJennifer M Ozan, DO;  Location: [facility];  Service: Gynecology;  Laterality: N/A;    Current Outpatient Medications on File Prior to Visit  Medication Sig Dispense Refill  . polyethylene glycol (MIRALAX / GLYCOLAX) packet Take 17 g by mouth daily as needed for mild constipation.    Marland Kitchen. acetaminophen (TYLENOL) 500 MG tablet Take 500 mg by mouth every 6 (six) hours as needed for headache.    . b complex vitamins tablet Take 1 tablet by mouth daily.    . folic acid (FOLVITE) 400 MCG tablet Take 400 mcg by mouth daily.    . Prenatal Vit-Fe Fumarate-FA (MULTIVITAMIN-PRENATAL) 27-0.8 MG TABS Take 1 tablet by mouth daily at 12 noon.    . shark liver  oil-cocoa butter (PREPARATION H) 0.25-3-85.5 % suppository Place 1 suppository rectally as needed for hemorrhoids.     No current facility-administered medications on file prior to visit.     No Known Allergies  Social History   Socioeconomic History  . Marital status: Married    Spouse name: Not on file  . Number of children: Not on file  . Years of education: Not on file  . Highest education level: Not on file  Social Needs  . Financial resource strain: Not on file  . Food insecurity - worry: Not on file  . Food insecurity - inability: Not on file  . Transportation needs - medical: Not on file  . Transportation needs - non-medical: Not on file  Occupational History  . Not on file  Tobacco Use  . Smoking status: Never Smoker  . Smokeless tobacco: Never Used  Substance and Sexual Activity  . Alcohol use: No  . Drug use: No  . Sexual activity: Yes  Other Topics Concern  . Not on file  Social History Narrative   Marital status: married; from [place]; moved to states 2011.      Children: three      Lives: with husband.      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 Cancer Neg Hx   . Diabetes Neg Hx    The following portions of the patient's history were reviewed and updated as appropriate: allergies, current medications, past family history, past medical history, past social history, past surgical history and problem list.  Review of Systems Pertinent items noted in HPI and remainder of comprehensive ROS otherwise negative.   Objective:  BP 120/68 (BP Location: Left Arm, Patient Position: Sitting, Cuff Size: Small)   Pulse 60   Ht 5\' 8"  (1.727 m)   Wt 131 lb 14.4 oz (59.8 kg)   LMP 01/10/2018 (Exact Date)   Breastfeeding? No   BMI 20.06 kg/m  CONSTITUTIONAL: Well-developed, well-nourished female in no acute  distress.  HENT:  Normocephalic, atraumatic, External right and left ear normal. Oropharynx is clear and moist EYES: Conjunctivae and EOM are normal. Pupils are equal, round, and reactive to light. No scleral icterus.  NECK: Normal range of motion, supple, no masses.  Normal thyroid.  SKIN: Skin is warm and dry. No rash noted. Not diaphoretic. No erythema. No pallor. NEUROLOGIC: Alert and oriented to person, place, and time. Normal reflexes, muscle tone coordination. No cranial nerve deficit noted. PSYCHIATRIC: Normal mood and affect. Normal behavior. Normal judgment and thought content. CARDIOVASCULAR: Normal heart rate noted, regular rhythm RESPIRATORY: Clear to auscultation bilaterally. Effort and breath sounds normal, no problems with respiration noted. BREASTS: Symmetric in size, bilateral implants in place. No masses, skin changes, nipple drainage, or lymphadenopathy. ABDOMEN: Soft, normal bowel sounds, no distention noted.  No tenderness, rebound or guarding.  PELVIC: Normal appearing external genitalia; normal appearing vaginal mucosa and cervix.  No abnormal discharge noted.  Pap smear obtained.  Normal uterine size, no other palpable masses, no uterine or adnexal tenderness. MUSCULOSKELETAL: Normal range of motion. No tenderness.  No cyanosis, clubbing, or edema.  2+ distal pulses.   Assessment and Plan:  1. Acne vulgaris Medications refilled.  Dermatology referral done as per her request. - Ambulatory referral to Dermatology - benzoyl peroxide-erythromycin (BENZAMYCIN) gel; Apply topically 2 (two) times daily.  Dispense: 23.3 g; Refill: 5 - tretinoin (RETIN-A) 0.025 % cream; Apply topically at bedtime.  Dispense: 45 g; Refill: 5  2. Encounter for gynecological examination with Papanicolaou smear of cervix - Cytology - PAP Will follow up results of pap smear and manage accordingly. Routine preventative health maintenance measures emphasized. Please refer to After Visit Summary for  other counseling recommendations.    Jaynie Collins, MD, FACOG Obstetrician & Gynecologist, [facility] for [employer], [facility]

## 2018-01-14 NOTE — Patient Instructions (Signed)
Preventive Care 18-39 Years, Female Preventive care refers to lifestyle choices and visits with your health care provider that can promote health and wellness. What does preventive care include?  A yearly physical exam. This is also called an annual well check.  Dental exams once or twice a year.  Routine eye exams. Ask your health care provider how often you should have your eyes checked.  Personal lifestyle choices, including: ? Daily care of your teeth and gums. ? Regular physical activity. ? Eating a healthy diet. ? Avoiding tobacco and drug use. ? Limiting alcohol use. ? Practicing safe sex. ? Taking vitamin and mineral supplements as recommended by your health care provider. What happens during an annual well check? The services and screenings done by your health care provider during your annual well check will depend on your age, overall health, lifestyle risk factors, and family history of disease. Counseling Your health care provider may ask you questions about your:  Alcohol use.  Tobacco use.  Drug use.  Emotional well-being.  Home and relationship well-being.  Sexual activity.  Eating habits.  Work and work Statistician.  Method of birth control.  Menstrual cycle.  Pregnancy history.  Screening You may have the following tests or measurements:  Height, weight, and BMI.  Diabetes screening. This is done by checking your blood sugar (glucose) after you have not eaten for a while (fasting).  Blood pressure.  Lipid and cholesterol levels. These may be checked every 5 years starting at age 81.  Skin check.  Hepatitis C blood test.  Hepatitis B blood test.  Sexually transmitted disease (STD) testing.  BRCA-related cancer screening. This may be done if you have a family history of breast, ovarian, tubal, or peritoneal cancers.  Pelvic exam and Pap test. This may be done every 3 years starting at age 52. Starting at age 48, this may be done every 5  years if you have a Pap test in combination with an HPV test.  Discuss your test results, treatment options, and if necessary, the need for more tests with your health care provider. Vaccines Your health care provider may recommend certain vaccines, such as:  Influenza vaccine. This is recommended every year.  Tetanus, diphtheria, and acellular pertussis (Tdap, Td) vaccine. You may need a Td booster every 10 years.  Varicella vaccine. You may need this if you have not been vaccinated.  HPV vaccine. If you are 26 or younger, you may need three doses over 6 months.  Measles, mumps, and rubella (MMR) vaccine. You may need at least one dose of MMR. You may also need a second dose.  Pneumococcal 13-valent conjugate (PCV13) vaccine. You may need this if you have certain conditions and were not previously vaccinated.  Pneumococcal polysaccharide (PPSV23) vaccine. You may need one or two doses if you smoke cigarettes or if you have certain conditions.  Meningococcal vaccine. One dose is recommended if you are age 68-21 years and a first-year college student living in a residence hall, or if you have one of several medical conditions. You may also need additional booster doses.  Hepatitis A vaccine. You may need this if you have certain conditions or if you travel or work in places where you may be exposed to hepatitis A.  Hepatitis B vaccine. You may need this if you have certain conditions or if you travel or work in places where you may be exposed to hepatitis B.  Haemophilus influenzae type b (Hib) vaccine. You may need this if  you have certain risk factors.  Talk to your health care provider about which screenings and vaccines you need and how often you need them. This information is not intended to replace advice given to you by your health care provider. Make sure you discuss any questions you have with your health care provider. Document Released: 12/23/2001 Document Revised: 07/16/2016  Document Reviewed: 08/28/2015 Elsevier Interactive Patient Education  [employer].

## 2018-01-15 LAB — CYTOLOGY - PAP
DIAGNOSIS: NEGATIVE
HPV: NOT DETECTED

## 2018-03-25 ENCOUNTER — Encounter: Payer: Self-pay | Admitting: *Deleted

## 2018-08-10 ENCOUNTER — Other Ambulatory Visit: Payer: Self-pay | Admitting: Obstetrics & Gynecology

## 2018-08-10 DIAGNOSIS — L700 Acne vulgaris: Secondary | ICD-10-CM

## 2019-05-26 DIAGNOSIS — L810 Postinflammatory hyperpigmentation: Secondary | ICD-10-CM | POA: Diagnosis not present

## 2019-05-26 DIAGNOSIS — L709 Acne, unspecified: Secondary | ICD-10-CM | POA: Diagnosis not present

## 2019-06-30 DIAGNOSIS — L709 Acne, unspecified: Secondary | ICD-10-CM | POA: Diagnosis not present

## 2019-06-30 DIAGNOSIS — Z5181 Encounter for therapeutic drug level monitoring: Secondary | ICD-10-CM | POA: Diagnosis not present

## 2019-06-30 DIAGNOSIS — L700 Acne vulgaris: Secondary | ICD-10-CM | POA: Diagnosis not present

## 2019-07-04 MED FILL — MYORISAN 40 MG CAPSULE: 40 | 30 days supply | Qty: 30 | Fill #0

## 2019-08-02 DIAGNOSIS — K130 Diseases of lips: Secondary | ICD-10-CM | POA: Diagnosis not present

## 2019-08-02 DIAGNOSIS — L709 Acne, unspecified: Secondary | ICD-10-CM | POA: Diagnosis not present

## 2019-08-02 DIAGNOSIS — Z5181 Encounter for therapeutic drug level monitoring: Secondary | ICD-10-CM | POA: Diagnosis not present

## 2019-08-02 DIAGNOSIS — L700 Acne vulgaris: Secondary | ICD-10-CM | POA: Diagnosis not present

## 2019-08-02 DIAGNOSIS — L810 Postinflammatory hyperpigmentation: Secondary | ICD-10-CM | POA: Diagnosis not present

## 2019-08-03 MED FILL — MYORISAN 40 MG CAPSULE: 40 | 30 days supply | Qty: 30 | Fill #0

## 2019-09-05 DIAGNOSIS — Z5181 Encounter for therapeutic drug level monitoring: Secondary | ICD-10-CM | POA: Diagnosis not present

## 2019-09-05 DIAGNOSIS — L709 Acne, unspecified: Secondary | ICD-10-CM | POA: Diagnosis not present

## 2019-09-05 DIAGNOSIS — L700 Acne vulgaris: Secondary | ICD-10-CM | POA: Diagnosis not present

## 2019-09-05 DIAGNOSIS — K130 Diseases of lips: Secondary | ICD-10-CM | POA: Diagnosis not present

## 2019-09-06 MED FILL — MYORISAN 40 MG CAPSULE: 40 | 30 days supply | Qty: 30 | Fill #0

## 2019-10-05 DIAGNOSIS — Z5181 Encounter for therapeutic drug level monitoring: Secondary | ICD-10-CM | POA: Diagnosis not present

## 2019-10-05 DIAGNOSIS — L700 Acne vulgaris: Secondary | ICD-10-CM | POA: Diagnosis not present

## 2019-10-05 DIAGNOSIS — K130 Diseases of lips: Secondary | ICD-10-CM | POA: Diagnosis not present

## 2019-10-05 DIAGNOSIS — L709 Acne, unspecified: Secondary | ICD-10-CM | POA: Diagnosis not present

## 2019-10-10 MED FILL — MYORISAN 40 MG CAPSULE: 40 | 30 days supply | Qty: 30 | Fill #0

## 2019-10-31 ENCOUNTER — Telehealth (INDEPENDENT_AMBULATORY_CARE_PROVIDER_SITE_OTHER): Payer: 59 | Admitting: Obstetrics & Gynecology

## 2019-10-31 DIAGNOSIS — N898 Other specified noninflammatory disorders of vagina: Secondary | ICD-10-CM

## 2019-10-31 MED ORDER — FLUCONAZOLE 150 MG PO TABS: 150.0000 mg | ORAL_TABLET | Freq: Once | ORAL | 1 refills | Status: AC

## 2019-10-31 NOTE — Telephone Encounter (Signed)
Please call patient about getting a Rx, or if she should come in the office.

## 2019-10-31 NOTE — Telephone Encounter (Signed)
Called pt back. She reports has a yeast infection. She is having vaginal discharge with no smell and some vaginal itching.   Pt reports she generally needs 2 tablets 3 days apart to treat her infections. She reports she generally does not clear on one dose.   Prescription sent to Pharmacy per protocol.

## 2019-11-08 DIAGNOSIS — K130 Diseases of lips: Secondary | ICD-10-CM | POA: Diagnosis not present

## 2019-11-08 DIAGNOSIS — L700 Acne vulgaris: Secondary | ICD-10-CM | POA: Diagnosis not present

## 2019-11-08 DIAGNOSIS — L709 Acne, unspecified: Secondary | ICD-10-CM | POA: Diagnosis not present

## 2019-11-08 DIAGNOSIS — Z5181 Encounter for therapeutic drug level monitoring: Secondary | ICD-10-CM | POA: Diagnosis not present

## 2019-11-10 MED FILL — MYORISAN 40 MG CAPSULE: 40 | 30 days supply | Qty: 30 | Fill #0

## 2019-12-09 DIAGNOSIS — Z5181 Encounter for therapeutic drug level monitoring: Secondary | ICD-10-CM | POA: Diagnosis not present

## 2019-12-09 DIAGNOSIS — L709 Acne, unspecified: Secondary | ICD-10-CM | POA: Diagnosis not present

## 2019-12-09 DIAGNOSIS — L700 Acne vulgaris: Secondary | ICD-10-CM | POA: Diagnosis not present

## 2019-12-09 DIAGNOSIS — K130 Diseases of lips: Secondary | ICD-10-CM | POA: Diagnosis not present

## 2019-12-12 MED FILL — MYORISAN 40 MG CAPSULE: 40 | 30 days supply | Qty: 30 | Fill #0

## 2020-01-10 DIAGNOSIS — K130 Diseases of lips: Secondary | ICD-10-CM | POA: Diagnosis not present

## 2020-01-10 DIAGNOSIS — L700 Acne vulgaris: Secondary | ICD-10-CM | POA: Diagnosis not present

## 2020-01-10 DIAGNOSIS — L709 Acne, unspecified: Secondary | ICD-10-CM | POA: Diagnosis not present

## 2020-01-10 DIAGNOSIS — Z5181 Encounter for therapeutic drug level monitoring: Secondary | ICD-10-CM | POA: Diagnosis not present

## 2020-01-13 MED FILL — MYORISAN 40 MG CAPSULE: 40 | 30 days supply | Qty: 30 | Fill #0

## 2020-02-09 ENCOUNTER — Other Ambulatory Visit: Payer: Self-pay

## 2020-02-09 ENCOUNTER — Encounter: Payer: Self-pay | Admitting: Physician Assistant

## 2020-02-09 ENCOUNTER — Ambulatory Visit (INDEPENDENT_AMBULATORY_CARE_PROVIDER_SITE_OTHER): Payer: 59 | Admitting: Physician Assistant

## 2020-02-09 DIAGNOSIS — L700 Acne vulgaris: Secondary | ICD-10-CM | POA: Diagnosis not present

## 2020-02-09 MED ORDER — ZILXI 1.5 % EX FOAM: 1.0000 "application " | Freq: Every evening | CUTANEOUS | 0 refills | Status: DC

## 2020-02-09 NOTE — Progress Notes (Signed)
   New Patient   Subjective  Cheryl Bailey is a 39 y.o. female who presents for the following: No chief complaint on file.She has completed her goal for isotretinoin. She has not had any breakouts and has been tolerating her medication well.   The following portions of the chart were reviewed this encounter and updated as appropriate: Tobacco  Med Hx  Surg Hx  Fam Hx  Soc Hx     Objective  Well appearing patient in no apparent distress; mood and affect are within normal limits.  A focused examination was performed including face. Relevant physical exam findings are noted in the Assessment and Plan. No PIH completely clear.   Assessment & Plan  Acne: finish isotretinoin.zilxi for any flares. Pt. To call if problems. 31 day final pregnancy test

## 2020-02-09 NOTE — Patient Instructions (Signed)
Patient was given 2 samples of Zilxi 1.5%: L: [id number] Exp: April 2021

## 2020-02-10 LAB — PREGNANCY, URINE: Preg Test, Ur: NEGATIVE

## 2020-03-16 ENCOUNTER — Ambulatory Visit: Payer: 59 | Admitting: Physician Assistant

## 2020-04-27 NOTE — Addendum Note (Signed)
Addended by: Mackey Birchwood R on: 04/27/2020 05:42 PM   Modules accepted: Level of Service

## 2020-06-05 ENCOUNTER — Other Ambulatory Visit: Payer: Self-pay

## 2020-06-05 ENCOUNTER — Ambulatory Visit (INDEPENDENT_AMBULATORY_CARE_PROVIDER_SITE_OTHER): Payer: 59 | Admitting: *Deleted

## 2020-06-05 DIAGNOSIS — L700 Acne vulgaris: Secondary | ICD-10-CM

## 2020-06-05 NOTE — Patient Instructions (Signed)
Patient will call if she needs anything.

## 2020-06-06 LAB — PREGNANCY, URINE: Preg Test, Ur: NEGATIVE

## 2020-08-24 DIAGNOSIS — Z20828 Contact with and (suspected) exposure to other viral communicable diseases: Secondary | ICD-10-CM | POA: Diagnosis not present

## 2020-09-17 ENCOUNTER — Ambulatory Visit: Payer: 59 | Admitting: Obstetrics & Gynecology

## 2020-09-18 ENCOUNTER — Other Ambulatory Visit (HOSPITAL_COMMUNITY)
Admission: RE | Admit: 2020-09-18 | Discharge: 2020-09-18 | Disposition: A | Discharge status: Home / Self Care | Payer: 59 | Source: Ambulatory Visit | Attending: Obstetrics & Gynecology | Admitting: Obstetrics & Gynecology

## 2020-09-18 ENCOUNTER — Other Ambulatory Visit: Payer: Self-pay

## 2020-09-18 ENCOUNTER — Encounter: Payer: Self-pay | Admitting: Obstetrics and Gynecology

## 2020-09-18 ENCOUNTER — Ambulatory Visit (INDEPENDENT_AMBULATORY_CARE_PROVIDER_SITE_OTHER): Payer: 59 | Admitting: Obstetrics and Gynecology

## 2020-09-18 VITALS — BP 118/76 | HR 66 | Ht 68.0 in | Wt 136.1 lb

## 2020-09-18 DIAGNOSIS — Z0000 Encounter for general adult medical examination without abnormal findings: Secondary | ICD-10-CM

## 2020-09-18 LAB — POCT PREGNANCY, URINE: Preg Test, Ur: NEGATIVE

## 2020-09-18 NOTE — Progress Notes (Deleted)
    GYNECOLOGY OFFICE PROCEDURE NOTE  Cheryl Bailey is a 39 y.o. G2P2003 here for *** IUD insertion. No GYN concerns.  Last pap smear was on *** and was normal.  IUD Insertion Procedure Note Patient identified, informed consent performed, consent signed.   Discussed risks of irregular bleeding, cramping, infection, malpositioning or misplacement of the IUD outside the uterus which may require further procedure such as laparoscopy. Also discussed >99% contraception efficacy, increased risk of ectopic pregnancy with failure of method.   Emphasized that this did not protect against STIs, condoms recommended during all sexual encounters. Time out was performed.  Urine pregnancy test negative.  Speculum placed in the vagina.  Cervix visualized.  Cleaned with Betadine x 2.  Grasped anteriorly with a single tooth tenaculum.  Uterus sounded to *** cm.  *** IUD placed per manufacturer's recommendations.  Strings trimmed to 3 cm. Tenaculum was removed, good hemostasis noted.  Patient tolerated procedure well.   Patient was given post-procedure instructions.  She was advised to have backup contraception for one week.  Patient was also asked to check IUD strings periodically and follow up in 4 weeks for IUD check.   Casper Harrison, MD [facility] Family Medicine Fellow, [facility] for [facility], [facility]

## 2020-09-18 NOTE — Progress Notes (Signed)
   History:  [name] is a 39 y.o. G2P2003 who presents to clinic today for annual gyn exam   Doing well. No concerns. No new sexual partners. No abnormal vaginal discharge. No pelvic pain.   Gynecologic History Patient's last menstrual period was 2-3 weeks ago and lasted 3 days. Reports periods are light with mirena IUD in place.  Contraception: IUD placed after last delivery in 2016, due for removal in 2023  Last Pap: 2019. Results were: normal (HPV cotesting was neg)   No family history of breast cancer.    The following portions of the patient's history were reviewed and updated as appropriate: allergies, current medications, family history, past medical history, social history, past surgical history and problem list.  Review of Systems:  Review of Systems  Constitutional: Negative for chills and fever.  Cardiovascular: Negative for chest pain.  Gastrointestinal: Negative for abdominal pain.  Genitourinary: Negative for frequency and urgency.  Skin: Negative for rash.  Neurological: Negative for dizziness and headaches.      Objective:  Physical Exam Ht 5\' 8"  (1.727 m)   Wt 136 lb 1.6 oz (61.7 kg)   BMI 20.69 kg/m  Physical Exam Vitals and nursing note reviewed.  Constitutional:      Appearance: Normal appearance.  HENT:     Head: Normocephalic and atraumatic.  Cardiovascular:     Rate and Rhythm: Normal rate and regular rhythm.     Heart sounds: Normal heart sounds.  Pulmonary:     Effort: Pulmonary effort is normal.     Breath sounds: Normal breath sounds.  Chest:     Breasts: Breasts are symmetrical.        Right: Normal. No mass or skin change.        Left: Normal. No mass or skin change.  Abdominal:     General: Bowel sounds are normal.     Palpations: Abdomen is soft. There is no mass.  Musculoskeletal:     Cervical back: Normal range of motion.  Skin:    General: Skin is warm and dry.  Neurological:     Mental Status: She is alert.    Psychiatric:        Mood and Affect: Mood normal.        Behavior: Behavior normal.       Labs and Imaging No results found for this or any previous visit (from the past 24 hour(s)).  No results found.   Assessment & Plan:   Annual Physical Exam -doing well overall!  -reviewed health maintenance items, will obtain rpr, hiv, hep c, cholesterol, a1c today. Up to date on tdap, flu, covid vaccines.  -pap due in 2024 -IUD replacement to be done in 2023  -may return in 1-2 years for annual exam   2024, MD 09/18/2020 10:39 AM    13/07/2020, MD [facility] Family Medicine Fellow, [facility] for [facility], [facility]

## 2020-09-19 LAB — GC/CHLAMYDIA PROBE AMP (~~LOC~~) NOT AT ARMC
Chlamydia: NEGATIVE
Comment: NEGATIVE
Comment: NORMAL
Neisseria Gonorrhea: NEGATIVE

## 2020-09-19 LAB — LIPID PANEL
Chol/HDL Ratio: 2.7 ratio (ref 0.0–4.4)
Cholesterol, Total: 214 mg/dL — ABNORMAL HIGH (ref 100–199)
HDL: 78 mg/dL (ref >39)
LDL Chol Calc (NIH): 127 mg/dL — ABNORMAL HIGH (ref 0–99)
Triglycerides: 49 mg/dL (ref 0–149)
VLDL Cholesterol Cal: 9 mg/dL (ref 5–40)

## 2020-09-19 LAB — HIV ANTIBODY (ROUTINE TESTING W REFLEX): HIV Screen 4th Generation wRfx: NONREACTIVE

## 2020-09-19 LAB — RPR: RPR Ser Ql: NONREACTIVE

## 2020-09-19 LAB — HEMOGLOBIN A1C
Est. average glucose Bld gHb Est-mCnc: 82 mg/dL
Hgb A1c MFr Bld: 4.5 % — ABNORMAL LOW (ref 4.8–5.6)

## 2020-09-19 LAB — HEPATITIS C ANTIBODY: Hep C Virus Ab: <0.1 s/co ratio (ref 0.0–0.9)

## 2022-02-25 ENCOUNTER — Encounter: Payer: Self-pay | Admitting: Family Medicine

## 2022-02-26 ENCOUNTER — Ambulatory Visit: Payer: 59 | Admitting: Obstetrics & Gynecology

## 2022-03-20 ENCOUNTER — Ambulatory Visit (INDEPENDENT_AMBULATORY_CARE_PROVIDER_SITE_OTHER): Payer: 59 | Admitting: Obstetrics & Gynecology

## 2022-03-20 ENCOUNTER — Encounter: Payer: Self-pay | Admitting: Obstetrics & Gynecology

## 2022-03-20 ENCOUNTER — Other Ambulatory Visit (HOSPITAL_COMMUNITY)
Admission: RE | Admit: 2022-03-20 | Discharge: 2022-03-20 | Disposition: A | Discharge status: Home / Self Care | Payer: 59 | Source: Ambulatory Visit | Attending: Obstetrics & Gynecology | Admitting: Obstetrics & Gynecology

## 2022-03-20 VITALS — BP 109/65 | HR 65 | Wt 131.6 lb

## 2022-03-20 DIAGNOSIS — Z01419 Encounter for gynecological examination (general) (routine) without abnormal findings: Secondary | ICD-10-CM | POA: Insufficient documentation

## 2022-03-20 DIAGNOSIS — Z1231 Encounter for screening mammogram for malignant neoplasm of breast: Secondary | ICD-10-CM

## 2022-03-20 DIAGNOSIS — Z30433 Encounter for removal and reinsertion of intrauterine contraceptive device: Secondary | ICD-10-CM

## 2022-03-20 MED ORDER — LEVONORGESTREL 20 MCG/DAY IU IUD
1.0000 | INTRAUTERINE_SYSTEM | Freq: Once | INTRAUTERINE | Status: AC
  Administered 2022-03-20: 1 via INTRAUTERINE

## 2022-03-20 NOTE — Patient Instructions (Signed)
Please schedule a mammogram at one of the following locations:  [place]: [phone]  [facility] in [place]:[phone] [address]  

## 2022-03-20 NOTE — Progress Notes (Signed)
? ?WELL-WOMAN EXAMINATION ?Patient name: Cheryl Bailey MRN [id number]  Date of birth: 1981-02-20 ?Chief Complaint:   ?Annual Exam ? ?History of Present Illness:   ?Cheryl Bailey is a 41 y.o. G31P2003  female being seen today for a routine well-woman exam.  ?Today she notes no acute complaints or concerns ? ?Mirena placed 2016- desires replacement ? ?No LMP recorded. (Menstrual status: IUD). ?Denies issues with her menses ?The current method of family planning is IUD.  ? ? ?Last pap 2020.  ?Last mammogram: ordered today. ?Last colonoscopy: n/a ? ? ?  03/20/2022  ? 11:29 AM 01/14/2018  ?  9:50 AM  ?Depression screen PHQ 2/9  ?Decreased Interest 0 0  ?Down, Depressed, Hopeless 0 0  ?PHQ - 2 Score 0 0  ?Altered sleeping 1 0  ?Tired, decreased energy 0 0  ?Change in appetite 0 0  ?Feeling bad or failure about yourself  0 0  ?Trouble concentrating 0 0  ?Moving slowly or fidgety/restless 0 0  ?Suicidal thoughts 0 0  ?PHQ-9 Score 1 0  ? ? ? ? ?Review of Systems:   ?Pertinent items are noted in HPI ?Denies any headaches, blurred vision, fatigue, shortness of breath, chest pain, abdominal pain, bowel movements, urination, or intercourse unless otherwise stated above. ? ?Pertinent History Reviewed:  ?Reviewed past medical,surgical, social and family history.  ?Reviewed problem list, medications and allergies. ?Physical Assessment:  ? ?Vitals:  ? 03/20/22 1002  ?BP: 109/65  ?Pulse: 65  ?Weight: 131 lb 9.6 oz (59.7 kg)  ?Body mass index is 20.01 kg/m?. ?  ?     Physical Examination:  ? General appearance - well appearing, and in no distress ? Mental status - alert, oriented to person, place, and time ? Psych:  She has a normal mood and affect ? Skin - warm and dry, normal color, no suspicious lesions noted ? Chest - effort normal, all lung fields clear to auscultation bilaterally ? Heart - normal rate and regular rhythm ? Neck:  midline trachea, no thyromegaly or nodules ? Breasts - breasts appear normal, no suspicious  masses, no skin or nipple changes or  axillary nodes, bilateral implants noted ? Abdomen - soft, nontender, nondistended, no masses or organomegaly ? Pelvic - VULVA: normal appearing vulva with no masses, tenderness or lesions  VAGINA: normal appearing vagina with normal color and discharge, no lesions  CERVIX: normal appearing cervix without discharge or lesions, no CMT, IUD strings visualized ? Thin prep pap is done with HR HPV cotesting ? UTERUS: uterus is felt to be normal size, shape, consistency and nontender  ? ADNEXA: No adnexal masses or tenderness noted. ? Extremities:  No swelling or varicosities noted ? ?Chaperone: Jobe Marker   ? ?IUD REMOVAL/REINSERTION ? ?The risks and benefits of the method and placement have been thouroughly reviewed with the patient and all questions were answered.  Specifically the patient is aware of failure rate of 11/998, expulsion of the IUD and of possible perforation.  The patient is aware of irregular bleeding due to the method and understands the incidence of irregular bleeding diminishes with time.  Signed copy of informed consent in chart.  ?Time out was performed. ? ?A peterson speculum was placed in the vagina.  The cervix was visualized, prepped using Betadine. The strings  visible. They were grasped and the Mirena IUD was easily removed. The cervix was then grasped with a single-tooth tenaculum. The uterus was found to be anteroflexed and it sounded to 7 cm.  Mirena  IUD placed per manufacturer's recommendations without complications. The strings were trimmed to approximately 3 cm.  The patient tolerated the procedure well.  ? ?Informal transvaginal sonogram was performed and the proper placement of the IUD was verified.  ? ? ? ?Assessment & Plan:  ?1) Well-Woman Exam ?-pap collected, reviewed guidelines ?-mammogram ordered ? ?2) IUD replacement ?-Mirena replaced without difficulty ?-f/u in 4-6wks ? ?Meds:  ?Meds ordered this encounter  ?Medications  ?  levonorgestrel (MIRENA) 20 MCG/DAY IUD 1 each  ? ? ?Follow-up: Return in about 6 weeks (around 05/01/2022) for IUD check up with Dr. Charlotta Newton, please print AVS. ? ? ?Myna Hidalgo, DO ?Attending Obstetrician & Gynecologist, Faculty Practice ?Center for [employer], [facility] ? ? ?

## 2022-03-25 LAB — CYTOLOGY - PAP
Comment: NEGATIVE
Diagnosis: NEGATIVE
High risk HPV: NEGATIVE

## 2022-04-30 ENCOUNTER — Ambulatory Visit: Payer: 59 | Admitting: Obstetrics & Gynecology

## 2022-05-06 ENCOUNTER — Ambulatory Visit
Admission: RE | Admit: 2022-05-06 | Discharge: 2022-05-06 | Disposition: A | Discharge status: Home / Self Care | Payer: 59 | Source: Ambulatory Visit | Attending: Obstetrics & Gynecology | Admitting: Obstetrics & Gynecology

## 2022-05-06 DIAGNOSIS — Z1231 Encounter for screening mammogram for malignant neoplasm of breast: Secondary | ICD-10-CM | POA: Diagnosis not present

## 2022-05-07 ENCOUNTER — Ambulatory Visit: Payer: 59 | Admitting: Obstetrics & Gynecology

## 2022-06-17 ENCOUNTER — Ambulatory Visit: Payer: 59 | Admitting: Obstetrics & Gynecology

## 2023-07-08 ENCOUNTER — Ambulatory Visit (INDEPENDENT_AMBULATORY_CARE_PROVIDER_SITE_OTHER): Payer: 59 | Admitting: Obstetrics & Gynecology

## 2023-07-08 ENCOUNTER — Encounter: Payer: Self-pay | Admitting: Obstetrics & Gynecology

## 2023-07-08 VITALS — BP 114/70 | HR 60 | Ht 68.0 in | Wt 139.8 lb

## 2023-07-08 DIAGNOSIS — Z1329 Encounter for screening for other suspected endocrine disorder: Secondary | ICD-10-CM

## 2023-07-08 DIAGNOSIS — Z13220 Encounter for screening for lipoid disorders: Secondary | ICD-10-CM

## 2023-07-08 DIAGNOSIS — Z131 Encounter for screening for diabetes mellitus: Secondary | ICD-10-CM

## 2023-07-08 DIAGNOSIS — Z975 Presence of (intrauterine) contraceptive device: Secondary | ICD-10-CM | POA: Diagnosis not present

## 2023-07-08 DIAGNOSIS — Z1321 Encounter for screening for nutritional disorder: Secondary | ICD-10-CM | POA: Diagnosis not present

## 2023-07-08 DIAGNOSIS — Z01419 Encounter for gynecological examination (general) (routine) without abnormal findings: Secondary | ICD-10-CM | POA: Diagnosis not present

## 2023-07-08 NOTE — Progress Notes (Signed)
WELL-WOMAN EXAMINATION Patient name: Cheryl Bailey MRN [id number]  Date of birth: June 15, 1981 Chief Complaint:   Gynecologic Exam  History of Present Illness:   Cheryl Bailey is a 42 y.o. G49P2003 female being seen today for a routine well-woman exam.   Mirena placed 03/2022: Occasional light period every few months.  Denies HMB or dysmenorrhea.  Denies abnormal discharge, itching or irritation.  Denies pelvic or abdominal pain.  She overall is doing well and reports no acute complaints   No LMP recorded. (Menstrual status: IUD). Denies issues with her menses The current method of family planning is IUD.    Last pap 03/2022.  Last mammogram: 04/2022. Last colonoscopy: na     07/08/2023    2:13 PM 03/20/2022   11:29 AM 01/14/2018    9:50 AM  Depression screen PHQ 2/9  Decreased Interest 0 0 0  Down, Depressed, Hopeless 0 0 0  PHQ - 2 Score 0 0 0  Altered sleeping 0 1 0  Tired, decreased energy 0 0 0  Change in appetite 0 0 0  Feeling bad or failure about yourself  0 0 0  Trouble concentrating 0 0 0  Moving slowly or fidgety/restless 0 0 0  Suicidal thoughts 0 0 0  PHQ-9 Score 0 1 0      Review of Systems:   Pertinent items are noted in HPI Denies any headaches, blurred vision, fatigue, shortness of breath, chest pain, abdominal pain, bowel movements, urination, or intercourse unless otherwise stated above.  Pertinent History Reviewed:  Reviewed past medical,surgical, social and family history.  Reviewed problem list, medications and allergies. Physical Assessment:   Vitals:   07/08/23 1410  BP: 114/70  Pulse: 60  Weight: 139 lb 12.8 oz (63.4 kg)  Height: 5\' 8"  (1.727 m)  Body mass index is 21.26 kg/m.        Physical Examination:   General appearance - well appearing, and in no distress  Mental status - alert, oriented to person, place, and time  Psych:  She has a normal mood and affect  Skin - warm and dry, normal color, no suspicious lesions  noted  Chest - effort normal, all lung fields clear to auscultation bilaterally  Heart - normal rate and regular rhythm  Neck:  midline trachea, no thyromegaly or nodules  Breasts - breasts appear normal, no suspicious masses, no skin or nipple changes or  axillary nodes  Abdomen - soft, nontender, nondistended, no masses or organomegaly  Pelvic - VULVA: normal appearing vulva with no masses, tenderness or lesions  VAGINA: normal appearing vagina with normal color and discharge, no lesions  CERVIX: normal appearing cervix without discharge or lesions, no CMT, strings visualized at os  UTERUS: uterus is felt to be normal size, shape, consistency and nontender   ADNEXA: No adnexal masses or tenderness noted.  Extremities:  No swelling or varicosities noted  Chaperone:  pt declined      Assessment & Plan:  1) Well-Woman Exam -Pap up-to-date, reviewed ASCCP guidelines -Mammogram 04/2022  2) IUD in place  Screening lab work ordered  Meds: No orders of the defined types were placed in this encounter.  Orders Placed This Encounter  Procedures   Comprehensive metabolic panel    Standing Status:   Future    Standing Expiration Date:   07/07/2024   CBC    Standing Status:   Future    Standing Expiration Date:   07/07/2024   TSH    Standing  Status:   Future    Standing Expiration Date:   07/07/2024   VITAMIN D 25 Hydroxy (Vit-D Deficiency, Fractures)    Standing Status:   Future    Standing Expiration Date:   07/07/2024   HgB A1c    Standing Status:   Future    Standing Expiration Date:   07/07/2024   Lipid panel    Standing Status:   Future    Standing Expiration Date:   07/07/2024     Follow-up: Return in about 1 year (around 07/07/2024) for Annual.   Myna Hidalgo, DO Attending Obstetrician & Gynecologist, Faculty Practice Center for [facility], [facility]

## 2023-07-08 NOTE — Addendum Note (Signed)
Addended by: Annamarie Dawley on: 07/08/2023 02:45 PM   Modules accepted: Orders

## 2023-07-09 LAB — CBC
Hematocrit: 35.4 % (ref 34.0–46.6)
Hemoglobin: 11.3 g/dL (ref 11.1–15.9)
MCH: 30.7 pg (ref 26.6–33.0)
MCHC: 31.9 g/dL (ref 31.5–35.7)
MCV: 96 fL (ref 79–97)
Platelets: 250 10*3/uL (ref 150–450)
RBC: 3.68 x10E6/uL — ABNORMAL LOW (ref 3.77–5.28)
RDW: 11.5 % — ABNORMAL LOW (ref 11.7–15.4)
WBC: 4.1 10*3/uL (ref 3.4–10.8)

## 2023-07-09 LAB — COMPREHENSIVE METABOLIC PANEL
ALT: 11 IU/L (ref 0–32)
AST: 14 IU/L (ref 0–40)
Albumin: 4.2 g/dL (ref 3.9–4.9)
Alkaline Phosphatase: 47 IU/L (ref 44–121)
BUN/Creatinine Ratio: 13 (ref 9–23)
BUN: 11 mg/dL (ref 6–24)
Bilirubin Total: 0.3 mg/dL (ref 0.0–1.2)
CO2: 22 mmol/L (ref 20–29)
Calcium: 10 mg/dL (ref 8.7–10.2)
Chloride: 104 mmol/L (ref 96–106)
Creatinine, Ser: 0.88 mg/dL (ref 0.57–1.00)
Globulin, Total: 2.8 g/dL (ref 1.5–4.5)
Glucose: 77 mg/dL (ref 70–99)
Potassium: 4.3 mmol/L (ref 3.5–5.2)
Sodium: 138 mmol/L (ref 134–144)
Total Protein: 7 g/dL (ref 6.0–8.5)
eGFR: 85 mL/min/{1.73_m2} (ref >59)

## 2023-07-09 LAB — LIPID PANEL
Chol/HDL Ratio: 2.5 ratio (ref 0.0–4.4)
Cholesterol, Total: 187 mg/dL (ref 100–199)
HDL: 74 mg/dL (ref >39)
LDL Chol Calc (NIH): 104 mg/dL — ABNORMAL HIGH (ref 0–99)
Triglycerides: 48 mg/dL (ref 0–149)
VLDL Cholesterol Cal: 9 mg/dL (ref 5–40)

## 2023-07-09 LAB — HEMOGLOBIN A1C
Est. average glucose Bld gHb Est-mCnc: 91 mg/dL
Hgb A1c MFr Bld: 4.8 % (ref 4.8–5.6)

## 2023-07-09 LAB — TSH: TSH: 1.94 u[IU]/mL (ref 0.450–4.500)

## 2023-07-09 LAB — VITAMIN D 25 HYDROXY (VIT D DEFICIENCY, FRACTURES): Vit D, 25-Hydroxy: 27.7 ng/mL — ABNORMAL LOW (ref 30.0–100.0)

## 2023-07-15 ENCOUNTER — Other Ambulatory Visit: Payer: Self-pay | Admitting: Obstetrics & Gynecology

## 2023-07-15 DIAGNOSIS — Z1231 Encounter for screening mammogram for malignant neoplasm of breast: Secondary | ICD-10-CM

## 2023-08-07 ENCOUNTER — Ambulatory Visit: Payer: 59

## 2023-09-04 ENCOUNTER — Ambulatory Visit
Admission: RE | Admit: 2023-09-04 | Discharge: 2023-09-04 | Disposition: A | Discharge status: Home / Self Care | Payer: 59 | Source: Ambulatory Visit | Attending: Obstetrics & Gynecology

## 2023-09-04 DIAGNOSIS — Z1231 Encounter for screening mammogram for malignant neoplasm of breast: Secondary | ICD-10-CM | POA: Diagnosis not present

## 2024-02-24 ENCOUNTER — Encounter: Payer: Self-pay | Admitting: Obstetrics & Gynecology

## 2024-02-25 ENCOUNTER — Other Ambulatory Visit: Payer: Self-pay | Admitting: Obstetrics & Gynecology

## 2024-02-25 DIAGNOSIS — N760 Acute vaginitis: Secondary | ICD-10-CM

## 2024-02-25 MED ORDER — FLUCONAZOLE 150 MG PO TABS
ORAL_TABLET | ORAL | 4 refills | Status: AC
Start: 2024-02-25 — End: ?

## 2024-02-25 NOTE — Progress Notes (Signed)
 Rx for Diflucan due to vaginitis  Miciah Covelli, DO Attending Obstetrician & Gynecologist, [facility] for [employer], [facility]

## 2024-07-08 ENCOUNTER — Ambulatory Visit: Admitting: Adult Health

## 2024-10-24 ENCOUNTER — Other Ambulatory Visit: Payer: Self-pay | Admitting: Obstetrics & Gynecology

## 2024-10-24 ENCOUNTER — Encounter: Payer: Self-pay | Admitting: Obstetrics & Gynecology

## 2024-10-24 DIAGNOSIS — Z1231 Encounter for screening mammogram for malignant neoplasm of breast: Secondary | ICD-10-CM

## 2024-10-26 ENCOUNTER — Inpatient Hospital Stay: Admission: RE | Admit: 2024-10-26 | Discharge: 2024-10-26 | Attending: Obstetrics & Gynecology

## 2024-10-26 DIAGNOSIS — Z1231 Encounter for screening mammogram for malignant neoplasm of breast: Secondary | ICD-10-CM | POA: Diagnosis not present

## 2024-10-30 ENCOUNTER — Ambulatory Visit: Payer: Self-pay | Admitting: Obstetrics & Gynecology
